# Patient Record
Sex: Female | Born: 1955 | Race: White | Hispanic: No | Marital: Married | Smoking: Never smoker
Health system: Southern US, Community
[De-identification: ages and names within clinical notes are randomized; demographics above are authoritative.]

## PROBLEM LIST (undated history)

## (undated) DIAGNOSIS — H15091 Other scleritis, right eye: Secondary | ICD-10-CM

## (undated) DIAGNOSIS — D649 Anemia, unspecified: Secondary | ICD-10-CM

## (undated) DIAGNOSIS — Z833 Family history of diabetes mellitus: Secondary | ICD-10-CM

## (undated) DIAGNOSIS — N979 Female infertility, unspecified: Secondary | ICD-10-CM

## (undated) DIAGNOSIS — K4190 Unilateral femoral hernia, without obstruction or gangrene, not specified as recurrent: Secondary | ICD-10-CM

## (undated) DIAGNOSIS — R896 Abnormal cytological findings in specimens from other organs, systems and tissues: Secondary | ICD-10-CM

## (undated) DIAGNOSIS — Z811 Family history of alcohol abuse and dependence: Secondary | ICD-10-CM

## (undated) DIAGNOSIS — N809 Endometriosis, unspecified: Secondary | ICD-10-CM

## (undated) DIAGNOSIS — M810 Age-related osteoporosis without current pathological fracture: Secondary | ICD-10-CM

## (undated) DIAGNOSIS — N6009 Solitary cyst of unspecified breast: Secondary | ICD-10-CM

## (undated) HISTORY — DX: Age-related osteoporosis without current pathological fracture: M81.0

## (undated) HISTORY — DX: Unilateral femoral hernia, without obstruction or gangrene, not specified as recurrent: K41.90

## (undated) HISTORY — DX: Female infertility, unspecified: N97.9

## (undated) HISTORY — PX: BUNIONECTOMY: SHX129

## (undated) HISTORY — PX: COLONOSCOPY: SHX174

## (undated) HISTORY — DX: Anemia, unspecified: D64.9

## (undated) HISTORY — DX: Abnormal cytological findings in specimens from other organs, systems and tissues: R89.6

## (undated) HISTORY — DX: Endometriosis, unspecified: N80.9

## (undated) HISTORY — DX: Solitary cyst of unspecified breast: N60.09

## (undated) HISTORY — DX: Family history of diabetes mellitus: Z83.3

## (undated) HISTORY — DX: Family history of alcohol abuse and dependence: Z81.1

## (undated) HISTORY — PX: TONSILLECTOMY: SUR1361

## (undated) HISTORY — DX: Other scleritis, right eye: H15.091

---

## 1988-10-27 HISTORY — PX: LAPAROSCOPY: SHX197

## 1989-10-27 HISTORY — PX: OTHER SURGICAL HISTORY: SHX169

## 1990-10-27 HISTORY — PX: OTHER SURGICAL HISTORY: SHX169

## 1998-10-25 ENCOUNTER — Other Ambulatory Visit: Admission: RE | Admit: 1998-10-25 | Discharge: 1998-10-25 | Payer: Self-pay | Admitting: Obstetrics and Gynecology

## 1999-11-15 ENCOUNTER — Other Ambulatory Visit: Admission: RE | Admit: 1999-11-15 | Discharge: 1999-11-15 | Payer: Self-pay | Admitting: Obstetrics and Gynecology

## 2000-12-26 ENCOUNTER — Emergency Department (HOSPITAL_COMMUNITY): Admission: EM | Admit: 2000-12-26 | Discharge: 2000-12-27 | Payer: Self-pay | Admitting: Internal Medicine

## 2001-08-24 ENCOUNTER — Encounter: Admission: RE | Admit: 2001-08-24 | Discharge: 2001-08-24 | Payer: Self-pay | Admitting: Orthopedic Surgery

## 2001-08-24 ENCOUNTER — Encounter: Payer: Self-pay | Admitting: Orthopedic Surgery

## 2001-12-31 ENCOUNTER — Other Ambulatory Visit: Admission: RE | Admit: 2001-12-31 | Discharge: 2001-12-31 | Payer: Self-pay | Admitting: Obstetrics and Gynecology

## 2003-02-17 ENCOUNTER — Other Ambulatory Visit: Admission: RE | Admit: 2003-02-17 | Discharge: 2003-02-17 | Payer: Self-pay | Admitting: Obstetrics and Gynecology

## 2004-03-27 ENCOUNTER — Other Ambulatory Visit: Admission: RE | Admit: 2004-03-27 | Discharge: 2004-03-27 | Payer: Self-pay | Admitting: Obstetrics and Gynecology

## 2004-10-27 DIAGNOSIS — IMO0001 Reserved for inherently not codable concepts without codable children: Secondary | ICD-10-CM

## 2004-10-27 HISTORY — DX: Reserved for inherently not codable concepts without codable children: IMO0001

## 2005-05-21 ENCOUNTER — Other Ambulatory Visit: Admission: RE | Admit: 2005-05-21 | Discharge: 2005-05-21 | Payer: Self-pay | Admitting: Obstetrics and Gynecology

## 2005-11-21 ENCOUNTER — Other Ambulatory Visit: Admission: RE | Admit: 2005-11-21 | Discharge: 2005-11-21 | Payer: Self-pay | Admitting: Obstetrics and Gynecology

## 2006-07-10 ENCOUNTER — Other Ambulatory Visit: Admission: RE | Admit: 2006-07-10 | Discharge: 2006-07-10 | Payer: Self-pay | Admitting: Obstetrics and Gynecology

## 2006-07-31 ENCOUNTER — Ambulatory Visit: Payer: Self-pay | Admitting: Gastroenterology

## 2006-08-26 ENCOUNTER — Ambulatory Visit: Payer: Self-pay | Admitting: Gastroenterology

## 2007-06-03 ENCOUNTER — Encounter: Payer: Self-pay | Admitting: Family Medicine

## 2007-09-07 ENCOUNTER — Other Ambulatory Visit: Admission: RE | Admit: 2007-09-07 | Discharge: 2007-09-07 | Payer: Self-pay | Admitting: Obstetrics and Gynecology

## 2008-09-15 ENCOUNTER — Other Ambulatory Visit: Admission: RE | Admit: 2008-09-15 | Discharge: 2008-09-15 | Payer: Self-pay | Admitting: Obstetrics and Gynecology

## 2008-09-20 ENCOUNTER — Encounter: Payer: Self-pay | Admitting: Family Medicine

## 2009-10-08 LAB — CONVERTED CEMR LAB: Pap Smear: NORMAL

## 2010-09-16 ENCOUNTER — Ambulatory Visit: Payer: Self-pay | Admitting: Family Medicine

## 2010-09-16 DIAGNOSIS — Z9189 Other specified personal risk factors, not elsewhere classified: Secondary | ICD-10-CM | POA: Insufficient documentation

## 2010-09-16 DIAGNOSIS — M810 Age-related osteoporosis without current pathological fracture: Secondary | ICD-10-CM

## 2010-10-11 ENCOUNTER — Encounter: Payer: Self-pay | Admitting: Family Medicine

## 2010-11-26 NOTE — Letter (Signed)
Summary: Cardiac & Vascular Screening/[facility]  Cardiac & Vascular Screening/[facility]   Imported By: Lanelle Bal 09/26/2010 09:25:11  _____________________________________________________________________  External Attachment:    Type:   Image     Comment:   External Document

## 2010-11-26 NOTE — Assessment & Plan Note (Signed)
Summary: NEW PT TO EST/ [employer]   CYD   Vital Signs:  Patient profile:   55 year old female LMP:     05/27/2009 Height:      67 inches Weight:      119 pounds BMI:     18.71 Temp:     98.4 degrees F oral Pulse rate:   64 / minute Pulse rhythm:   regular BP sitting:   118 / 72  (left arm) Cuff size:   regular  Vitals Entered By: Delilah Shan CMA [facility]) (September 16, 2010 10:09 AM) CC: New Patient to Establish LMP (date): 05/27/2009     Enter LMP: 05/27/2009 Last PAP Result normal   History of Present Illness: H/o osteopenina with follow up DXA to be done in near future.  Has good vit D/calcium intake.  H/o spiral fx in L 5th MT.  H/o weight bearing exercise added into weekly routine.   Allergies (verified): No Known Drug Allergies  Past History:  Past Medical History: OSTEOPENIA (ICD-733.90) FAMILY HISTORY DIABETES 1ST DEGREE RELATIVE (ICD-V18.0) FAMILY HISTORY OF ALCOHOLISM/ADDICTION (ICD-V61.41) CHICKENPOX, HX OF (ICD-V15.9)    Past Surgical History: 1963  Tonsils and adenoids 1990  Laparoscopy 1991  Breast Biopsy and benign lump removal 1991  Abdominal - Endometriosis 2011  Bunionectomy  Family History: Reviewed history and no changes required. Family History of Alcoholism/Addiction, brother Family History of Arthritis, parents, grandparents Family History Diabetes 1st degree relative, sister Family History High cholesterol, parents Family History Hypertension, parents, grandparents Family History of Parkinson's, mother Family History of Heart Disease, parents, grandparents F alive, little contact with patient, CAD (6 CABG) M alive with parkinson's disease in ALF parents are separated  Social History: Reviewed history and no changes required. Occupation:  Therapist, music Education:  Masters (MBA) From [place] Married, 1980 1 son died after premature birth 1 son alive with h/o spinal cord injury, now ambulatory (in med school at  [employer]) rare alcohol no smoking exercises several times a week and works at her church Masters at [employer], works at [employer]  Review of Systems       See HPI.  Otherwise negative.    Physical Exam  General:  GEN: nad, alert and oriented HEENT: mucous membranes moist NECK: supple w/o LA CV: rrr.  no murmur PULM: ctab, no inc wob ABD: soft, +bs EXT: no edema SKIN: no acute rash    Impression & Recommendations:  Problem # 1:  OSTEOPENIA (ICD-733.90) has follow up for DXA via gyn. No change in meds at this time. follow up as needed. She agrees.  Her updated medication list for this problem includes:    Ergocalciferol 50000 Unit Caps (Ergocalciferol) .Marland Kitchen... Take 1 capsule 2 times per month    Calcium Carbonate-vitamin D 600-400 Mg-unit Tabs (Calcium carbonate-vitamin d) .Marland Kitchen... Take 1 tablet by mouth once a day  Complete Medication List: 1)  Ergocalciferol 50000 Unit Caps (Ergocalciferol) .... Take 1 capsule 2 times per month 2)  Calcium Carbonate-vitamin D 600-400 Mg-unit Tabs (Calcium carbonate-vitamin d) .... Take 1 tablet by mouth once a day 3)  Multivitamins Tabs (Multiple vitamin) .... Take 1 tablet by mouth once a day  Patient Instructions: 1)  Take care.  Glad to see you today.  Please have your records forwarded here and I'll review them.  Let me know if you have any questions or concerns.     Orders Added: 1)  Est. Patient Level II [16109]   Immunization History:  Influenza Immunization History:  Influenza:  historical (08/28/2010)   Immunization History:  Influenza Immunization History:    Influenza:  Historical (08/28/2010)  Current Allergies (reviewed today): No known allergies    Orders Added: 1)  Est. Patient Level II [91478]   Preventive Care Screening  Mammogram:    Date:  04/25/2010    Results:  normal   Pap Smear:    Date:  10/08/2009    Results:  normal   Colonoscopy:    Date:  08/26/2006    Results:  normal

## 2011-05-05 ENCOUNTER — Encounter: Payer: Self-pay | Admitting: *Deleted

## 2011-05-06 ENCOUNTER — Encounter: Payer: Self-pay | Admitting: Family Medicine

## 2012-05-07 ENCOUNTER — Encounter: Payer: Self-pay | Admitting: Family Medicine

## 2012-05-10 ENCOUNTER — Encounter: Payer: Self-pay | Admitting: *Deleted

## 2012-06-15 ENCOUNTER — Encounter: Payer: Self-pay | Admitting: Family Medicine

## 2012-06-15 ENCOUNTER — Ambulatory Visit (INDEPENDENT_AMBULATORY_CARE_PROVIDER_SITE_OTHER): Payer: BC Managed Care – PPO | Admitting: Family Medicine

## 2012-06-15 VITALS — BP 104/78 | HR 69 | Temp 98.3°F | Wt 120.0 lb

## 2012-06-15 DIAGNOSIS — L989 Disorder of the skin and subcutaneous tissue, unspecified: Secondary | ICD-10-CM | POA: Insufficient documentation

## 2012-06-15 MED ORDER — TRIAMCINOLONE ACETONIDE 0.1 % EX CREA: TOPICAL_CREAM | Freq: Two times a day (BID) | CUTANEOUS | Status: AC

## 2012-06-15 NOTE — Patient Instructions (Addendum)
Use the triamcinolone on the area twice a day.  It should gradually resolve.  Take care.

## 2012-06-15 NOTE — Assessment & Plan Note (Signed)
Unclear source, doesn't appear infected.  Would use topical TAC for the irritation and f/u prn.  She agrees.

## 2012-06-15 NOTE — Progress Notes (Signed)
"  Bites" on R lower shin (2 spots). Not itching as much now but puffy and red after exercise or being upright.  It was itching prev and had a blister.  No known or identified cause.  Present for about 1 week. It was redder last night after exercise, better this AM.  Not ttp but the skin feels sensitive and puffy.    Meds, vitals, and allergies reviewed.   ROS: See HPI.  Otherwise, noncontributory.  nad ncat rrr ctab R lower shin with 2 lesions, both appear to be flat excoriated lesions with central scabbing but w/o fluctuance.  Each about 1 cm diameter with mild surrounding erythema.

## 2012-09-06 ENCOUNTER — Ambulatory Visit (INDEPENDENT_AMBULATORY_CARE_PROVIDER_SITE_OTHER): Payer: BC Managed Care – PPO

## 2012-09-06 DIAGNOSIS — Z23 Encounter for immunization: Secondary | ICD-10-CM

## 2013-03-20 ENCOUNTER — Other Ambulatory Visit: Payer: Self-pay | Admitting: Obstetrics and Gynecology

## 2013-03-22 NOTE — Telephone Encounter (Signed)
eScribe request for refill on PROGESTERONE 100 Last filled - 12/10/12, 90 day x 3 Last AEX - 12/10/12 Next AEX - 12/16/13 RX denied.  Pt given refill for 1 year at AEX on 12/10/12.

## 2013-05-12 ENCOUNTER — Encounter: Payer: Self-pay | Admitting: Obstetrics and Gynecology

## 2013-05-12 ENCOUNTER — Telehealth: Payer: Self-pay | Admitting: Obstetrics and Gynecology

## 2013-05-13 ENCOUNTER — Ambulatory Visit (INDEPENDENT_AMBULATORY_CARE_PROVIDER_SITE_OTHER): Payer: BC Managed Care – PPO | Admitting: Obstetrics and Gynecology

## 2013-05-13 ENCOUNTER — Other Ambulatory Visit: Payer: Self-pay | Admitting: Orthopedic Surgery

## 2013-05-13 ENCOUNTER — Encounter: Payer: Self-pay | Admitting: Obstetrics and Gynecology

## 2013-05-13 VITALS — BP 118/70 | HR 82 | Resp 20 | Ht 66.75 in | Wt 119.0 lb

## 2013-05-13 DIAGNOSIS — N63 Unspecified lump in unspecified breast: Secondary | ICD-10-CM

## 2013-05-13 NOTE — Progress Notes (Signed)
57 yo MWF G2P1101 on femring 0.05 mg and P4 100 mg qd.    MM July 2013 nl.  Pt noted a small "cyst" in right breast on BSE, and when she went for her routine MM yesterday, she mentioned it, and they would not do a MM without an order from me. Pt reports cyst is non tender and has not changed in size over about 2 weeks.  No FH breast cancer.,  Exam:  Breast appear symmetrical, no skin changes, no adenopathy.  Left breast nl.  Right breast felt nl to me, but when I asked pt to point out the area of concern, she was able to point out a "BB" like area at 9 o'clock about 2 fb outside the areola.  It is very very very mobile, smooth, and completely c/w a tiny cyst.    A:  Right breast cyst, small  P:  Refer to [facility] for diag MM and poss usg on Rt, Screening MM on left.

## 2013-06-23 ENCOUNTER — Ambulatory Visit (INDEPENDENT_AMBULATORY_CARE_PROVIDER_SITE_OTHER): Payer: BC Managed Care – PPO | Admitting: Family Medicine

## 2013-06-23 ENCOUNTER — Encounter: Payer: Self-pay | Admitting: Family Medicine

## 2013-06-23 VITALS — BP 102/70 | HR 73 | Temp 97.5°F | Wt 118.0 lb

## 2013-06-23 DIAGNOSIS — H6980 Other specified disorders of Eustachian tube, unspecified ear: Secondary | ICD-10-CM | POA: Insufficient documentation

## 2013-06-23 DIAGNOSIS — H6990 Unspecified Eustachian tube disorder, unspecified ear: Secondary | ICD-10-CM | POA: Insufficient documentation

## 2013-06-23 DIAGNOSIS — H6981 Other specified disorders of Eustachian tube, right ear: Secondary | ICD-10-CM

## 2013-06-23 MED ORDER — FLUTICASONE PROPIONATE 50 MCG/ACT NA SUSP: 2.0000 | Freq: Every day | NASAL | Status: DC

## 2013-06-23 NOTE — Progress Notes (Signed)
3-4 months of sx.  Stuffy, runny, waking with HA.  R max pain, R ear feels clogged, postnasal gtt.  No fevers. Blowing nose, sneezing.  occ ear popping and her hearing improves. Taking mucinex with relief, stopped med and sx returned immediately.  She doesn't typically have seasonal allergies this time of year.  Also took advil cold and sinus prev.  Not much cough.    Meds, vitals, and allergies reviewed.   ROS: See HPI.  Otherwise, noncontributory.  GEN: nad, alert and oriented HEENT: mucous membranes moist, tm w/o erythema, nasal exam w/o erythema, scant clear discharge noted,  OP without cobblestoning, R ETD noted, R max sinus minimally ttp NECK: supple w/o LA CV: rrr.   PULM: ctab, no inc wob R lateral subconjunctival injection noted.

## 2013-06-23 NOTE — Assessment & Plan Note (Signed)
Path/phys d/w pt.  Restart mucinex for now and add on flonase, then stop mucinex after a few days.  F/u prn.  Nontoxic.  Gently try to pop ears.

## 2013-06-23 NOTE — Patient Instructions (Addendum)
Gently try to pop your ears Start flonase and use mucinex in the meantime.  This should improve.

## 2013-12-16 ENCOUNTER — Encounter: Payer: Self-pay | Admitting: Obstetrics and Gynecology

## 2013-12-16 ENCOUNTER — Ambulatory Visit: Payer: Self-pay | Admitting: Obstetrics and Gynecology

## 2013-12-16 ENCOUNTER — Ambulatory Visit (INDEPENDENT_AMBULATORY_CARE_PROVIDER_SITE_OTHER): Payer: 59 | Admitting: Obstetrics and Gynecology

## 2013-12-16 VITALS — BP 127/59 | HR 77 | Resp 16 | Ht 66.75 in | Wt 118.0 lb

## 2013-12-16 DIAGNOSIS — R319 Hematuria, unspecified: Secondary | ICD-10-CM

## 2013-12-16 DIAGNOSIS — M949 Disorder of cartilage, unspecified: Secondary | ICD-10-CM

## 2013-12-16 DIAGNOSIS — M899 Disorder of bone, unspecified: Secondary | ICD-10-CM

## 2013-12-16 DIAGNOSIS — Z0000 Encounter for general adult medical examination without abnormal findings: Secondary | ICD-10-CM

## 2013-12-16 DIAGNOSIS — M8580 Other specified disorders of bone density and structure, unspecified site: Secondary | ICD-10-CM

## 2013-12-16 DIAGNOSIS — Z01419 Encounter for gynecological examination (general) (routine) without abnormal findings: Secondary | ICD-10-CM

## 2013-12-16 LAB — POCT URINALYSIS DIPSTICK
Bilirubin, UA: NEGATIVE
Glucose, UA: NEGATIVE
Ketones, UA: NEGATIVE
Leukocytes, UA: NEGATIVE
NITRITE UA: NEGATIVE
PROTEIN UA: NEGATIVE
RBC UA: 250
UROBILINOGEN UA: NEGATIVE
pH, UA: 7

## 2013-12-16 LAB — CBC
HCT: 39.5 % (ref 36.0–46.0)
Hemoglobin: 13.7 g/dL (ref 12.0–15.0)
MCH: 30 pg (ref 26.0–34.0)
MCHC: 34.7 g/dL (ref 30.0–36.0)
MCV: 86.4 fL (ref 78.0–100.0)
Platelets: 263 10*3/uL (ref 150–400)
RBC: 4.57 MIL/uL (ref 3.87–5.11)
RDW: 13.5 % (ref 11.5–15.5)
WBC: 6.1 10*3/uL (ref 4.0–10.5)

## 2013-12-16 LAB — COMPREHENSIVE METABOLIC PANEL
ALT: 15 U/L (ref 0–35)
AST: 15 U/L (ref 0–37)
Albumin: 4.7 g/dL (ref 3.5–5.2)
Alkaline Phosphatase: 36 U/L — ABNORMAL LOW (ref 39–117)
BUN: 14 mg/dL (ref 6–23)
CO2: 29 mEq/L (ref 19–32)
Calcium: 9.9 mg/dL (ref 8.4–10.5)
Chloride: 103 mEq/L (ref 96–112)
Creat: 0.7 mg/dL (ref 0.50–1.10)
Glucose, Bld: 82 mg/dL (ref 70–99)
Potassium: 4.6 mEq/L (ref 3.5–5.3)
Sodium: 139 mEq/L (ref 135–145)
Total Bilirubin: 0.8 mg/dL (ref 0.2–1.2)
Total Protein: 6.9 g/dL (ref 6.0–8.3)

## 2013-12-16 LAB — LIPID PANEL
Cholesterol: 202 mg/dL — ABNORMAL HIGH (ref 0–200)
HDL: 81 mg/dL (ref >39)
LDL Cholesterol: 109 mg/dL — ABNORMAL HIGH (ref 0–99)
Total CHOL/HDL Ratio: 2.5 Ratio
Triglycerides: 58 mg/dL (ref <150)
VLDL: 12 mg/dL (ref 0–40)

## 2013-12-16 LAB — HEMOGLOBIN, FINGERSTICK: Hemoglobin, fingerstick: 13 g/dL (ref 12.0–16.0)

## 2013-12-16 NOTE — Progress Notes (Signed)
GYNECOLOGY VISIT  PCP: Elsie Stain, MD [facility])  Referring provider:   HPI: 58 y.o.   Married  Caucasian  female   G2P1 with Patient's last menstrual period was 05/27/2009.   here for  Annual Exam/ Discuss HRT On and off HRT.     Stopped at the end of December. Occasional night sweats.  Sleeping well.  Family history of osteoporosis.  Personally has osteopenia.  Right breast cysts.  Ultrasound in January showing two cysts.  6 month follow up was recommended.  Has a right hernia. Occasionally protrudes.   Son moving to [place] for residency in Dermatology.   Hgb:  13.0 Urine:  250 blood - asymptomatic   GYNECOLOGIC HISTORY: Patient's last menstrual period was 05/27/2009. Sexually active:  no Partner preference: female Contraception:   Menoopausal Menopausal hormone therapy: Stop taking 09/2013 DES exposure:   no Blood transfusions:   no Sexually transmitted diseases:   HR HPV 2006 GYN procedures and prior surgeries:  Breast Biopsy 1991, Laparoscopy 1990 Last mammogram:    03/2013 (Cyst Rt Breast)   Rt Breast U/S 10/2013 (will monitor cyst, repeat in 6 mons)             Last pap and high risk HPV testing:   11/01/2010 neg   HRHPV testing was not done History of abnormal pap smear:  Yes  06/2005 ASC-US   +HPV   OB History   Grav Para Term Preterm Abortions TAB SAB Ect Mult Living   2 1        1        LIFESTYLE: Exercise:    Aerobics, Walking, Weights           Tobacco: no Alcohol: occ glass of wine Drug use:  no  OTHER HEALTH MAINTENANCE: Tetanus/TDap: 11/2012 Gardisil: no Influenza:  yes Zostavax: no  Bone density: Osteopenia 09/2010 Colonoscopy: 08/26/06 normal, repeat in 10 years  Cholesterol check: 11/2012  (great)  Family History  Problem Relation Age of Onset  . Arthritis Mother   . Hyperlipidemia Mother   . Hypertension Mother   . Parkinsonism Mother   . Heart disease Mother   . Arthritis Father   . Hyperlipidemia Father   . Hypertension Father    . Heart disease Father     CAD (6 CABG)  . Stroke Father   . Diabetes Sister   . Alcohol abuse Brother   . Arthritis Other   . Hypertension Other   . Heart disease Other     Patient Active Problem List   Diagnosis Date Noted  . ETD (eustachian tube dysfunction) 06/23/2013  . Skin lesion 06/15/2012  . OSTEOPENIA 09/16/2010  . CHICKENPOX, HX OF 09/16/2010   Past Medical History  Diagnosis Date  . Osteopenia   . Family history of diabetes mellitus     1st degree relative  . Family history of alcoholism   . Endometriosis   . ASCUS on Pap smear 2006    Asc-us pap w/HRHPV  . Infertility, female   . Femoral hernia     right  . Breast cyst 1998 1999    2005 x3  . Trisomy 13 11/93    [name]    Past Surgical History  Procedure Laterality Date  . Laparoscopy  1990    endometriosis  . Breast biopsy and benign lump removal Left 1992    Breast Bx atypia (L)  . Abdominal endometriosis  1991    removal right endometrioma  . Bunionectomy  2011 and  2012  . Tonsillectomy      ALLERGIES: Review of patient's allergies indicates no known allergies.  Current Outpatient Prescriptions  Medication Sig Dispense Refill  . Calcium Carbonate-Vitamin D (TH CALCIUM CARBONATE-VITAMIN D) 600-400 MG-UNIT per tablet Take 1 tablet by mouth daily.      . ergocalciferol (VITAMIN D2) 50000 UNITS capsule 50,000 Units. Every 2 weeks      . Multiple Vitamin (MULTIVITAMIN) tablet Take 1 tablet by mouth daily.      . Estradiol Acetate (FEMRING) 0.05 MG/24HR RING Place vaginally.      . fluticasone (FLONASE) 50 MCG/ACT nasal spray Place 2 sprays into the nose daily.  16 g  2  . progesterone (PROMETRIUM) 100 MG capsule Take 100 mg by mouth daily.       No current facility-administered medications for this visit.     ROS:  Pertinent items are noted in HPI.  SOCIAL HISTORY:  Married.   [employer] Scientist, physiological.  PHYSICAL EXAMINATION:    BP 127/59  Pulse 77  Resp 16  Ht 5' 6.75" (1.695 m)  Wt 118 lb  (53.524 kg)  BMI 18.63 kg/m2  LMP 05/27/2009   Wt Readings from Last 3 Encounters:  12/16/13 118 lb (53.524 kg)  06/23/13 118 lb (53.524 kg)  05/13/13 119 lb (53.978 kg)     Ht Readings from Last 3 Encounters:  12/16/13 5' 6.75" (1.695 m)  05/13/13 5' 6.75" (1.695 m)  09/16/10 5\' 7"  (1.702 m)    General appearance: alert, cooperative and appears stated age Head: Normocephalic, without obvious abnormality, atraumatic Neck: no adenopathy, supple, symmetrical, trachea midline and thyroid not enlarged, symmetric, no tenderness/mass/nodules Lungs: clear to auscultation bilaterally Breasts: Inspection negative, left breast with scar superiorly, No nipple retraction or dimpling, No nipple discharge or bleeding, No axillary or supraclavicular adenopathy, Normal to palpation without dominant masses Heart: regular rate and rhythm Abdomen: right inguinal? Hernia - nontender.  Thin, soft, non-tender; no masses,  no organomegaly Extremities: extremities normal, atraumatic, no cyanosis or edema Skin: Skin color, texture, turgor normal. No rashes or lesions Lymph nodes: Cervical, supraclavicular, and axillary nodes normal. No abnormal inguinal nodes palpated Neurologic: Grossly normal  Pelvic: External genitalia:  no lesions              Urethra:  normal appearing urethra with no masses, tenderness or lesions              Bartholins and Skenes: normal                 Vagina: normal appearing vagina with normal color and discharge, no lesions              Cervix: normal appearance              Pap and high risk HPV testing done: yes.            Bimanual Exam:  Uterus:  uterus is normal size, shape, consistency and nontender                                      Adnexa: normal adnexa in size, nontender and no masses                                      Rectovaginal: Confirms  Anus:  normal sphincter tone, no lesions  ASSESSMENT  Normal gynecologic  exam. Former HRT patient.  Osteopenia.  Right breast cysts.  Right inguinal hernia.  Microscopic hematuria.   PLAN  Mammogram recommended yearly.  Patient is also due for a right breast ultrasound recheck in July 2015.  Pap smear and high risk HPV testing. OK to stay off HRT.  Counseled on self breast exam, Calcium and vitamin D intake, exercise. Bone density. Lipid profile, CMP, CBC, Vit D. Referral will be made to General Surgery after patient does her personal research about surgeon of choice.  Urine micro and urine culture.  Return annually or prn   An After Visit Summary was printed and given to the patient.

## 2013-12-16 NOTE — Patient Instructions (Signed)

## 2013-12-17 LAB — URINALYSIS, MICROSCOPIC ONLY
Bacteria, UA: NONE SEEN
CASTS: NONE SEEN
CRYSTALS: NONE SEEN
SQUAMOUS EPITHELIAL / LPF: NONE SEEN

## 2013-12-17 LAB — VITAMIN D 25 HYDROXY (VIT D DEFICIENCY, FRACTURES): Vit D, 25-Hydroxy: 60 ng/mL (ref 30–89)

## 2013-12-17 LAB — URINE CULTURE
Colony Count: NO GROWTH
Organism ID, Bacteria: NO GROWTH

## 2013-12-21 ENCOUNTER — Encounter: Payer: Self-pay | Admitting: Obstetrics and Gynecology

## 2013-12-21 LAB — IPS PAP TEST WITH HPV

## 2013-12-27 ENCOUNTER — Telehealth: Payer: Self-pay | Admitting: Obstetrics and Gynecology

## 2013-12-27 ENCOUNTER — Other Ambulatory Visit: Payer: Self-pay | Admitting: Obstetrics and Gynecology

## 2013-12-27 DIAGNOSIS — K4090 Unilateral inguinal hernia, without obstruction or gangrene, not specified as recurrent: Secondary | ICD-10-CM

## 2013-12-27 NOTE — Telephone Encounter (Signed)
Order for referral to Dr. Margot Chimes placed by me.   Thank you!

## 2013-12-27 NOTE — Telephone Encounter (Signed)
Spoke with pt to inform her Pap and HPV testing was negative. Pt pleased. Pt is requesting Dr. Margot Chimes from [facility] for her referral. Advised we would place referral and they would contact her for an appt. Pt agreeable.

## 2013-12-27 NOTE — Telephone Encounter (Signed)
Pt would like a referral to [facility] for a hernia that she and Dr Quincy Simmonds discussed. Would also like to get the results from her pap smear.

## 2013-12-28 NOTE — Telephone Encounter (Signed)
Sabrina Cirri, do you know if this patient is aware of appointment? Should I call?

## 2013-12-29 NOTE — Telephone Encounter (Signed)
Sabrina Hansen,  Patient is aware. Dr's office contacted the patient directly to schedule.

## 2014-01-05 ENCOUNTER — Encounter (INDEPENDENT_AMBULATORY_CARE_PROVIDER_SITE_OTHER): Payer: Self-pay | Admitting: General Surgery

## 2014-01-05 ENCOUNTER — Ambulatory Visit (INDEPENDENT_AMBULATORY_CARE_PROVIDER_SITE_OTHER): Payer: 59 | Admitting: General Surgery

## 2014-01-05 VITALS — BP 122/70 | HR 70 | Temp 98.4°F | Resp 12 | Ht 66.0 in | Wt 121.0 lb

## 2014-01-05 DIAGNOSIS — K4090 Unilateral inguinal hernia, without obstruction or gangrene, not specified as recurrent: Secondary | ICD-10-CM | POA: Insufficient documentation
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
## 2014-01-05 NOTE — Patient Instructions (Signed)
[facility] _______[facility]  INGUINAL HERNIA REPAIR: POST OP INSTRUCTIONS  Always review your discharge instruction sheet given to you by the facility where your surgery was performed. IF YOU HAVE DISABILITY OR FAMILY LEAVE FORMS, YOU MUST BRING THEM TO THE OFFICE FOR PROCESSING.   DO NOT GIVE THEM TO YOUR DOCTOR.  1. A  prescription for pain medication may be given to you upon discharge.  Take your pain medication as prescribed, if needed.  If narcotic pain medicine is not needed, then you may take acetaminophen (Tylenol) or ibuprofen (Advil) as needed. 2. Take your usually prescribed medications unless otherwise directed. 3. If you need a refill on your pain medication, please contact your pharmacy.  They will contact our office to request authorization. Prescriptions will not be filled after 5 pm or on week-ends. 4. You should follow a light diet the first 24 hours after arrival home, such as soup and crackers, etc.  Be sure to include lots of fluids daily.  Resume your normal diet the day after surgery. 5. Most patients will experience some swelling and bruising in the groin.  Ice packs and reclining will help.  Swelling and bruising can take several days to resolve.  6. It is common to experience some constipation if taking pain medication after surgery.  Increasing fluid intake and taking a stool softener (such as Colace) will usually help or prevent this problem from occurring.  A mild laxative (Milk of Magnesia or Miralax) should be taken according to package directions if there are no bowel movements after 48 hours. 7. Unless discharge instructions indicate otherwise, you may remove your bandages 24-48 hours after surgery, and you may shower at that time.  You may have steri-strips (small skin tapes) in place directly over the incision.  These strips should be left on the skin for 7-10 days.  If your surgeon used skin glue on the incision, you may shower in 24 hours.  The glue will  flake off over the next 2-3 weeks.  Any sutures or staples will be removed at the office during your follow-up visit. 8. ACTIVITIES:  You may resume regular (light) daily activities beginning the next day-such as daily self-care, walking, climbing stairs-gradually increasing activities as tolerated.  You may have sexual intercourse when it is comfortable.  Refrain from any heavy lifting or straining until approved by your doctor. a. You may drive when you are no longer taking prescription pain medication, you can comfortably wear a seatbelt, and you can safely maneuver your car and apply brakes. b. RETURN TO WORK:  _Does work in one week. Full activities in 6 weeks._________________________________________________________ 9. You should see your doctor in the office for a follow-up appointment approximately 2-3 weeks after your surgery.  Make sure that you call for this appointment within a day or two after you arrive home to insure a convenient appointment time. 10. OTHER INSTRUCTIONS:  __________________________________________________________________________________________________________________________________________________________________________________________  WHEN TO CALL YOUR DOCTOR: 1. Fever over 101.0 2. Inability to urinate 3. Nausea and/or vomiting 4. Extreme swelling or bruising 5. Continued bleeding from incision. 6. Increased pain, redness, or drainage from the incision  The clinic staff is available to answer your questions during regular business hours.  Please don't hesitate to call and ask to speak to one of the nurses for clinical concerns.  If you have a medical emergency, go to the nearest emergency room or call 911.  A surgeon from [facility] is always on call at the hospital  [address] [place], [place], [place]  [address] ?  [address], [place], [place]   [address] [phone] ? [phone] ? FAX [phone] Web site:  [email or web address]

## 2014-01-17 ENCOUNTER — Other Ambulatory Visit: Payer: Self-pay | Admitting: Obstetrics and Gynecology

## 2014-01-17 NOTE — Telephone Encounter (Addendum)
Last AEX 12/16/2013 Next appt 12/22/2014 Last AEX note stated pt was OK to stay off HRT.   Please approve or deny rx.  Paper Chart in your door.

## 2014-04-05 ENCOUNTER — Telehealth (INDEPENDENT_AMBULATORY_CARE_PROVIDER_SITE_OTHER): Payer: Self-pay

## 2014-04-05 NOTE — Telephone Encounter (Signed)
Unsuccessful attempt to reach pt on her cell and home phone.  She is scheduled to return to see Dr. Zella Richer on 04/19/14 at 10:10 a.m., to discuss and schedule inguinal hernia repair.

## 2014-04-19 ENCOUNTER — Encounter (INDEPENDENT_AMBULATORY_CARE_PROVIDER_SITE_OTHER): Payer: 59 | Admitting: General Surgery

## 2014-05-27 HISTORY — PX: INGUINAL HERNIA REPAIR: SUR1180

## 2014-06-22 ENCOUNTER — Other Ambulatory Visit (INDEPENDENT_AMBULATORY_CARE_PROVIDER_SITE_OTHER): Payer: Self-pay

## 2014-06-22 DIAGNOSIS — K4090 Unilateral inguinal hernia, without obstruction or gangrene, not specified as recurrent: Secondary | ICD-10-CM

## 2014-06-22 MED ORDER — OXYCODONE HCL 5 MG PO TABS: 5.0000 mg | ORAL_TABLET | ORAL | Status: DC | PRN

## 2014-07-12 ENCOUNTER — Encounter (INDEPENDENT_AMBULATORY_CARE_PROVIDER_SITE_OTHER): Payer: 59 | Admitting: General Surgery

## 2014-08-28 ENCOUNTER — Encounter (INDEPENDENT_AMBULATORY_CARE_PROVIDER_SITE_OTHER): Payer: Self-pay | Admitting: General Surgery

## 2014-10-27 DIAGNOSIS — M810 Age-related osteoporosis without current pathological fracture: Secondary | ICD-10-CM

## 2014-10-27 HISTORY — DX: Age-related osteoporosis without current pathological fracture: M81.0

## 2014-12-22 ENCOUNTER — Encounter: Payer: Self-pay | Admitting: Obstetrics and Gynecology

## 2014-12-22 ENCOUNTER — Ambulatory Visit (INDEPENDENT_AMBULATORY_CARE_PROVIDER_SITE_OTHER): Payer: 59 | Admitting: Obstetrics and Gynecology

## 2014-12-22 VITALS — BP 120/76 | HR 76 | Resp 12 | Ht 66.75 in | Wt 118.8 lb

## 2014-12-22 DIAGNOSIS — Z0000 Encounter for general adult medical examination without abnormal findings: Secondary | ICD-10-CM

## 2014-12-22 DIAGNOSIS — R319 Hematuria, unspecified: Secondary | ICD-10-CM

## 2014-12-22 DIAGNOSIS — Z01419 Encounter for gynecological examination (general) (routine) without abnormal findings: Secondary | ICD-10-CM

## 2014-12-22 LAB — CBC
HCT: 38.6 % (ref 36.0–46.0)
Hemoglobin: 12.9 g/dL (ref 12.0–15.0)
MCH: 29.4 pg (ref 26.0–34.0)
MCHC: 33.4 g/dL (ref 30.0–36.0)
MCV: 87.9 fL (ref 78.0–100.0)
MPV: 10.2 fL (ref 8.6–12.4)
PLATELETS: 250 10*3/uL (ref 150–400)
RBC: 4.39 MIL/uL (ref 3.87–5.11)
RDW: 13.1 % (ref 11.5–15.5)
WBC: 5.5 10*3/uL (ref 4.0–10.5)

## 2014-12-22 LAB — COMPREHENSIVE METABOLIC PANEL
ALT: 13 U/L (ref 0–35)
AST: 15 U/L (ref 0–37)
Albumin: 4.4 g/dL (ref 3.5–5.2)
Alkaline Phosphatase: 50 U/L (ref 39–117)
BUN: 12 mg/dL (ref 6–23)
CO2: 28 mEq/L (ref 19–32)
Calcium: 9.8 mg/dL (ref 8.4–10.5)
Chloride: 103 mEq/L (ref 96–112)
Creat: 0.82 mg/dL (ref 0.50–1.10)
Glucose, Bld: 83 mg/dL (ref 70–99)
Potassium: 4 mEq/L (ref 3.5–5.3)
SODIUM: 139 meq/L (ref 135–145)
TOTAL PROTEIN: 6.7 g/dL (ref 6.0–8.3)
Total Bilirubin: 0.6 mg/dL (ref 0.2–1.2)

## 2014-12-22 LAB — LIPID PANEL
CHOL/HDL RATIO: 2.1 ratio
Cholesterol: 193 mg/dL (ref 0–200)
HDL: 90 mg/dL (ref >=46)
LDL CALC: 93 mg/dL (ref 0–99)
Triglycerides: 49 mg/dL (ref <150)
VLDL: 10 mg/dL (ref 0–40)

## 2014-12-22 LAB — TSH: TSH: 0.67 u[IU]/mL (ref 0.350–4.500)

## 2014-12-22 NOTE — Patient Instructions (Signed)

## 2014-12-22 NOTE — Progress Notes (Signed)
sSPatient ID: Sabrina Hansen, female   DOB: 25-Apr-1956, 59 y.o.   MRN: [id number] 58 y.o. G2P1 MarriedCaucasianF here for annual exam.   No bleeding or spotting.  Stopped HRT in December 2014.   History of right breast cysts and right inguinal hernia repair.  Had mesh placement. Due in August for right breast follow up at [facility].  Father passed away.  Mother with Parkinson's disease.   Retiring in one month.  Feels good about this choice.   Feels tingling feeling in both arms and both legs occurring during stressful periods.  Improves with exercise.  Eats well.  Taking a multivitamin.   PCP:  Elsie Stain, MD  Patient's last menstrual period was 05/27/2009.          Sexually active: No.  The current method of family planning is post menopausal status.    Exercising: Yes.    aerobics, weights and walking. Smoker:  no  Health Maintenance: Pap:  12-16-13 wnl:neg HR HPV History of abnormal Pap:  Yes, 0/2006 Acus/Pos HR HPV, colposcopy 07-25-05 showed atypia on cervical bx and ECC negative.  No Treatment to cervix and paps normal since.  MMG:  11-17-13 u/s right breast revealed 81mm complex cyst at 11 o'clock, 32mm simple cyst in right breast at 10 o'clock.;mmg and u/s 04-2314  Revealed 66mm complex cyst probably benign and recommend repeat u/s 6 mo.  Ultrasound 12-02-14 showed complex cyst in right breast had decreased to 64mm, no evidence of malignancy.  Repeat u/s with mammogram in 05/2015:[facility] Colonoscopy:  08-26-2006 wnl with Dr. Verl Blalock.  Next colonoscopy 07/2016. BMD:   09/2010 osteopenia:[facility] TDaP:  11/2012 Screening Labs:  Hb today: 12.5, Urine today: Trace RBC's - Asymptomatic    reports that she has never smoked. She has never used smokeless tobacco. She reports that she drinks about 0.6 oz of alcohol per week. She reports that she does not use illicit drugs.  Past Medical History  Diagnosis Date  . Osteopenia   . Family history of diabetes mellitus     1st degree  relative  . Family history of alcoholism   . Endometriosis   . ASCUS on Pap smear 2006    Asc-us pap w/HRHPV  . Infertility, female   . Femoral hernia     right  . Breast cyst 1998 1999    2005 x3  . Trisomy 13 11/93    [name]    Past Surgical History  Procedure Laterality Date  . Laparoscopy  1990    endometriosis  . Breast biopsy and benign lump removal Left 1992    Breast Bx atypia (L)  . Abdominal endometriosis  1991    removal right endometrioma  . Bunionectomy  2011 and 2012  . Tonsillectomy    . Inguinal hernia repair  05/2014    Dr. Barkley Bruns    Current Outpatient Prescriptions  Medication Sig Dispense Refill  . Calcium Carbonate-Vitamin D (TH CALCIUM CARBONATE-VITAMIN D) 600-400 MG-UNIT per tablet Take 1 tablet by mouth daily.    . Multiple Vitamin (MULTIVITAMIN) tablet Take 1 tablet by mouth daily.     No current facility-administered medications for this visit.    Family History  Problem Relation Age of Onset  . Arthritis Mother   . Hyperlipidemia Mother   . Hypertension Mother   . Parkinsonism Mother   . Heart disease Mother   . Arthritis Father   . Hyperlipidemia Father   . Hypertension Father   . Heart disease Father  CAD (6 CABG)  . Stroke Father   . Diabetes Sister   . Alcohol abuse Brother   . Arthritis Other   . Hypertension Other   . Heart disease Other     ROS:  Pertinent items are noted in HPI.  Otherwise, a comprehensive ROS was negative.  Exam:   BP 120/76 mmHg  Pulse 76  Resp 12  Ht 5' 6.75" (1.695 m)  Wt 118 lb 12.8 oz (53.887 kg)  BMI 18.76 kg/m2  LMP 05/27/2009     Height: 5' 6.75" (169.5 cm)  Ht Readings from Last 3 Encounters:  12/22/14 5' 6.75" (1.695 m)  01/05/14 5\' 6"  (1.676 m)  12/16/13 5' 6.75" (1.695 m)    General appearance: alert, cooperative and appears stated age Head: Normocephalic, without obvious abnormality, atraumatic Neck: no adenopathy, supple, symmetrical, trachea midline and thyroid normal to  inspection and palpation Lungs: clear to auscultation bilaterally Breasts: normal appearance, no masses or tenderness, Inspection negative, No nipple retraction or dimpling, No nipple discharge or bleeding, No axillary or supraclavicular adenopathy Heart: regular rate and rhythm Abdomen: Pfannenstiel incision with extension into right groin for herniorrhaphy incision.  Abdomen soft, non-tender; bowel sounds normal; no masses,  no organomegaly Extremities: extremities normal, atraumatic, no cyanosis or edema Skin: Skin color, texture, turgor normal. No rashes or lesions Lymph nodes: Cervical, supraclavicular, and axillary nodes normal. No abnormal inguinal nodes palpated Neurologic: Grossly normal   Pelvic: External genitalia:  no lesions              Urethra:  normal appearing urethra with no masses, tenderness or lesions              Bartholins and Skenes: normal                 Vagina: normal appearing vagina with normal color and discharge, no lesions              Cervix: no lesions              Pap taken: No. Bimanual Exam:  Uterus:  normal size, contour, position, consistency, mobility, non-tender              Adnexa: normal adnexa and no mass, fullness, tenderness               Rectovaginal: Confirms               Anus:  normal sphincter tone, no lesions  Chaperone was present for exam.  A:  Well Woman with normal exam Osteopenia.  Microscopic hematuria.  Situational stress.   P:   Mammogram - due in August.  Bone density in the fall. Routine labs including TSH. Urine micro and culture.  Ca/vit D/weight bearing exercise.  pap smear not indicated. Congratulations on retirement.  return annually or prn

## 2014-12-23 LAB — URINALYSIS, MICROSCOPIC ONLY
Bacteria, UA: NONE SEEN
Casts: NONE SEEN
Crystals: NONE SEEN
Squamous Epithelial / LPF: NONE SEEN

## 2014-12-24 LAB — URINE CULTURE
COLONY COUNT: NO GROWTH
ORGANISM ID, BACTERIA: NO GROWTH

## 2014-12-25 LAB — HEMOGLOBIN, FINGERSTICK: HEMOGLOBIN, FINGERSTICK: 12.5 g/dL (ref 12.0–16.0)

## 2014-12-28 ENCOUNTER — Telehealth: Payer: Self-pay | Admitting: Obstetrics and Gynecology

## 2014-12-28 NOTE — Telephone Encounter (Signed)
Spoke with patient. Advised hemoglobin on 12/22/14 was 12.5. Patient is agreeable and verbalizes understanding.  Routing to provider for final review. Patient agreeable to disposition. Will close encounter

## 2014-12-28 NOTE — Telephone Encounter (Signed)
Patient calling for recent hemoglobin results.

## 2014-12-28 NOTE — Telephone Encounter (Signed)
Left message to call Sabrina Hansen at [phone]. 

## 2015-05-29 ENCOUNTER — Telehealth: Payer: Self-pay

## 2015-05-29 NOTE — Telephone Encounter (Signed)
Spoke with patient and advised BMD revealing Osteoporosis of Right hip and Dr. Quincy Simmonds would like her to schedule consultation to discuss further.  Made appointment with Dr. Quincy Simmonds on 06-15-15 at 9:30am.

## 2015-06-15 ENCOUNTER — Encounter: Payer: Self-pay | Admitting: Obstetrics and Gynecology

## 2015-06-15 ENCOUNTER — Ambulatory Visit (INDEPENDENT_AMBULATORY_CARE_PROVIDER_SITE_OTHER): Payer: 59 | Admitting: Obstetrics and Gynecology

## 2015-06-15 VITALS — BP 120/76 | HR 64 | Ht 66.75 in | Wt 120.2 lb

## 2015-06-15 DIAGNOSIS — M810 Age-related osteoporosis without current pathological fracture: Secondary | ICD-10-CM | POA: Diagnosis not present

## 2015-06-15 NOTE — Patient Instructions (Addendum)
Osteoporosis Throughout your life, your body breaks down old bone and replaces it with new bone. As you get older, your body does not replace bone as quickly as it breaks it down. By the age of 30 years, most people begin to gradually lose bone because of the imbalance between bone loss and replacement. Some people lose more bone than others. Bone loss beyond a specified normal degree is considered osteoporosis.  Osteoporosis affects the strength and durability of your bones. The inside of the ends of your bones and your flat bones, like the bones of your pelvis, look like honeycomb, filled with tiny open spaces. As bone loss occurs, your bones become less dense. This means that the open spaces inside your bones become bigger and the walls between these spaces become thinner. This makes your bones weaker. Bones of a person with osteoporosis can become so weak that they can break (fracture) during minor accidents, such as a simple fall. CAUSES  The following factors have been associated with the development of osteoporosis:  Smoking.  Drinking more than 2 alcoholic drinks several days per week.  Long-term use of certain medicines:  Corticosteroids.  Chemotherapy medicines.  Thyroid medicines.  Antiepileptic medicines.  Gonadal hormone suppression medicine.  Immunosuppression medicine.  Being underweight.  Lack of physical activity.  Lack of exposure to the sun. This can lead to vitamin D deficiency.  Certain medical conditions:  Certain inflammatory bowel diseases, such as Crohn disease and ulcerative colitis.  Diabetes.  Hyperthyroidism.  Hyperparathyroidism. RISK FACTORS Anyone can develop osteoporosis. However, the following factors can increase your risk of developing osteoporosis:  Gender--Women are at higher risk than men.  Age--Being older than 50 years increases your risk.  Ethnicity--White and Asian people have an increased risk.  Weight --Being extremely  underweight can increase your risk of osteoporosis.  Family history of osteoporosis--Having a family member who has developed osteoporosis can increase your risk. SYMPTOMS  Usually, people with osteoporosis have no symptoms.  DIAGNOSIS  Signs during a physical exam that may prompt your caregiver to suspect osteoporosis include:  Decreased height. This is usually caused by the compression of the bones that form your spine (vertebrae) because they have weakened and become fractured.  A curving or rounding of the upper back (kyphosis). To confirm signs of osteoporosis, your caregiver may request a procedure that uses 2 low-dose X-ray beams with different levels of energy to measure your bone mineral density (dual-energy X-ray absorptiometry [DXA]). Also, your caregiver may check your level of vitamin D. TREATMENT  The goal of osteoporosis treatment is to strengthen bones in order to decrease the risk of bone fractures. There are different types of medicines available to help achieve this goal. Some of these medicines work by slowing the processes of bone loss. Some medicines work by increasing bone density. Treatment also involves making sure that your levels of calcium and vitamin D are adequate. PREVENTION  There are things you can do to help prevent osteoporosis. Adequate intake of calcium and vitamin D can help you achieve optimal bone mineral density. Regular exercise can also help, especially resistance and weight-bearing activities. If you smoke, quitting smoking is an important part of osteoporosis prevention. MAKE SURE YOU:  Understand these instructions.  Will watch your condition.  Will get help right away if you are not doing well or get worse. FOR MORE INFORMATION www.osteo.org and www.nof.org Document Released: 07/23/2005 Document Revised: 02/07/2013 Document Reviewed: 09/27/2011 ExitCare Patient Information 2015 [employer]. This information is not   intended to replace advice  given to you by your health care provider. Make sure you discuss any questions you have with your health care provider.    Risedronate tablets What is this medicine? RISEDRONATE (ris ED roe nate) reduces calcium loss from bones. It helps make healthy bone and to slow bone loss in patients with Paget's disease and osteoporosis. It may be used in others at risk for bone loss. This medicine may be used for other purposes; ask your health care provider or pharmacist if you have questions. COMMON BRAND NAME(S): Actonel What should I tell my health care provider before I take this medicine? They need to know if you have any of these conditions: -dental disease -esophagus, stomach, or intestine problems, like acid reflux or GERD -kidney disease -low blood calcium -problems sitting or standing for 30 minutes -trouble swallowing -an unusual or allergic reaction to risedronate, other medicines, foods, dyes, or preservatives -pregnant or trying to get pregnant -breast-feeding How should I use this medicine? You must take this medication exactly as directed or you will lower the amount of medicine you absorb into your body or you may cause your self harm. Take this medicine by mouth first thing in the morning, after you are up for the day. Do not eat or drink anything before you take this medicine. Swallow the tablets with a full glass (6 to 8 fluid ounces) of plain water. Do not take the tablets with any other drink. Do not chew or crush the tablet. After taking this medicine, do not eat breakfast, drink, or take any other medicines or vitamins for at least 30 minutes. Stand or sit up for at least 30 minutes after you take this medicine; do not lie down. Do not take your medicine more often than directed. Talk to your pediatrician regarding the use of this medicine in children. Special care may be needed. Overdosage: If you think you have taken too much of this medicine contact a poison control center or  emergency room at once. NOTE: This medicine is only for you. Do not share this medicine with others. What if I miss a dose? If you miss a dose, do not take it later in the day. Take your normal dose the next morning. Do not take double or extra doses. What may interact with this medicine? -antacids like aluminum hydroxide or magnesium hydroxide -aspirin -calcium supplements -iron supplements -NSAIDs, medicines for pain and inflammation, like ibuprofen or naproxen -thyroid hormones -vitamins with minerals This list may not describe all possible interactions. Give your health care provider a list of all the medicines, herbs, non-prescription drugs, or dietary supplements you use. Also tell them if you smoke, drink alcohol, or use illegal drugs. Some items may interact with your medicine. What should I watch for while using this medicine? Visit your doctor or health care professional for regular check ups. It may be some time before you see the benefit from this medicine. Your doctor or health care professional may order blood tests and other tests to see how you are doing. You should make sure you get enough calcium and vitamin D while you are taking this medicine, unless your doctor tells you not to. Discuss the foods you eat and the vitamins you take with your health care professional. Some people who take this medicine have severe bone, joint, and/or muscle pain. This medicine may also increase your risk for a broken thigh bone. Tell your doctor right away if you have pain in your  upper leg or groin. Tell your doctor if you have any pain that does not go away or that gets worse. What side effects may I notice from receiving this medicine? Side effects that you should report to your doctor as soon as possible: -allergic reactions such as skin rash or itching, hives, swelling of the face, lips, throat, or tongue -black or tarry stools -changes in vision -heartburn or stomach pain -jaw pain,  especially after dental work -pain or difficulty when swallowing -redness, blistering, peeling, or loosening of the skin, including inside the mouth Side effects that usually do not require medical attention (report to your doctor if they continue or are bothersome): -bone, muscle, or joint pain -changes in taste -diarrhea or constipation -eye pain or itching -headache -nausea or vomiting -stomach gas or fullness This list may not describe all possible side effects. Call your doctor for medical advice about side effects. You may report side effects to FDA at 1-800-FDA-1088. Where should I keep my medicine? Keep out of the reach of children. Store at room temperature between 20 and 25 degrees C (68 and 77 degrees F). Throw away any unused medicine after the expiration date. NOTE: This sheet is a summary. It may not cover all possible information. If you have questions about this medicine, talk to your doctor, pharmacist, or health care provider.  2015, Elsevier/Gold Standard. (2012-09-03 16:21:37)  Raloxifene tablets What is this medicine? RALOXIFENE (ral OX i feen) reduces the amount of calcium lost from bones. It is used to treat and prevent osteoporosis in women who have experienced menopause. This medicine may be used for other purposes; ask your health care provider or pharmacist if you have questions. COMMON BRAND NAME(S): Evista What should I tell my health care provider before I take this medicine? They need to know if you have any of these conditions: -a history of blood clots -cancer -heart failure -liver disease -premenopausal -an unusual or allergic reaction to raloxifene, other medicines, foods, dyes, or preservatives -pregnant or trying to get pregnant -breast-feeding How should I use this medicine? Take this medicine by mouth with a glass of water. Follow the directions on the prescription label. The tablets can be taken with or without food. Take your doses at  regular intervals. Do not take your medicine more often than directed. Talk to your pediatrician regarding the use of this medicine in children. Special care may be needed. Overdosage: If you think you have taken too much of this medicine contact a poison control center or emergency room at once. NOTE: This medicine is only for you. Do not share this medicine with others. What if I miss a dose? If you miss a dose, take it as soon as you can. If it is almost time for your next dose, take only that dose. Do not take double or extra doses. What may interact with this medicine? -ampicillin -cholestyramine -colestipol -diazepam -diazoxide -female hormones like hormone replacement therapy -lidocaine -warfarin This list may not describe all possible interactions. Give your health care provider a list of all the medicines, herbs, non-prescription drugs, or dietary supplements you use. Also tell them if you smoke, drink alcohol, or use illegal drugs. Some items may interact with your medicine. What should I watch for while using this medicine? Visit your doctor or health care professional for regular checks on your progress. Do not stop taking this medicine except on the advice of your doctor or health care professional. You should make sure you get enough calcium  and vitamin D in your diet while you are taking this medicine. Discuss your dietary needs with your health care professional or nutritionist. Exercise may help to prevent bone loss. Discuss your exercise needs with your doctor or health care professional. This medicine can rarely cause blood clots. You should avoid long periods of bed rest while taking this medicine. If you are going to have surgery, tell your doctor or health care professional that you are taking this medicine. This medicine should be stopped at least 3 days before surgery. After surgery, it should be restarted only after you are walking again. It should not be restarted while  you still need long periods of bed rest. You should not smoke while taking this medicine. Smoking may also increase your risk of blood clots. Smoking can also decrease the effects of this medicine. This medicine does not prevent hot flashes. It may cause hot flashes in some patients at the start of therapy. What side effects may I notice from receiving this medicine? Side effects that you should report to your doctor or health care professional as soon as possible: -change in vision -chest pain -difficulty breathing -leg pain or swelling -skin rash, itching Side effects that usually do not require medical attention (report to your doctor or health care professional if they continue or are bothersome): -fluid build-up -leg cramps -stomach pain -sweating This list may not describe all possible side effects. Call your doctor for medical advice about side effects. You may report side effects to FDA at 1-800-FDA-1088. Where should I keep my medicine? Keep out of the reach of children. Store at room temperature between 15 and 30 degrees C (59 and 86 degrees F). Throw away any unused medicine after the expiration date. NOTE: This sheet is a summary. It may not cover all possible information. If you have questions about this medicine, talk to your doctor, pharmacist, or health care provider.  2015, Elsevier/Gold Standard. (2008-09-28 15:15:14)

## 2015-06-15 NOTE — Progress Notes (Signed)
Patient ID: Sabrina Hansen, female   DOB: 1956-04-24, 59 y.o.   MRN: [id number] GYNECOLOGY  VISIT   HPI: 59 y.o.   Married  Caucasian  female   G2P1 with Patient's last menstrual period was 05/27/2009.   here to discuss results of bone density study.     Bone density at [facility] 05/23/15 showing bone loss since 2011 and now dx of osteoporosis. T sore spine -0.5 Left hip -2.3 Right hip -2.5  Stopped HRT after two years of use. Stopped in December 2014.  Not a smoker.  No steroid use. ETOH:  2 every 30 days.  No malabsorptive syndrome. Normal TSH in February 2016.   Some difficulty swallowing large medications.  Mother and family members with osteoporosis. Mother with fracture history.  She takes Evista.  GYNECOLOGIC HISTORY: Patient's last menstrual period was 05/27/2009. Contraception:Postmenopausal Menopausal hormone therapy: None Last mammogram: 05-23-15 Bil.Diag.and [place] for follow up of complex cyst of Rt.Br./Density Cat.D/benign cyst Rt.Br./return to routine yearly evaluation:[facility] Last pap smear: 12-16-13 Neg:Neg        OB History    Gravida Para Term Preterm AB TAB SAB Ectopic Multiple Living   2 1        1          Patient Active Problem List   Diagnosis Date Noted  . Inguinal hernia 01/05/2014  . ETD (eustachian tube dysfunction) 06/23/2013  . Skin lesion 06/15/2012  . OSTEOPENIA 09/16/2010  . CHICKENPOX, HX OF 09/16/2010    Past Medical History  Diagnosis Date  . Osteopenia   . Family history of diabetes mellitus     1st degree relative  . Family history of alcoholism   . Endometriosis   . ASCUS on Pap smear 2006    Asc-us pap w/HRHPV  . Infertility, female   . Femoral hernia     right  . Breast cyst 1998 1999    2005 x3  . Trisomy 13 11/93    Sabrina Hansen    Past Surgical History  Procedure Laterality Date  . Laparoscopy  1990    endometriosis  . Breast biopsy and benign lump removal Left 1992    Breast Bx atypia (L)  . Abdominal endometriosis  1991     removal right endometrioma  . Bunionectomy  2011 and 2012  . Tonsillectomy    . Inguinal hernia repair  05/2014    Dr. Barkley Bruns    Current Outpatient Prescriptions  Medication Sig Dispense Refill  . Calcium Carbonate-Vitamin D (TH CALCIUM CARBONATE-VITAMIN D) 600-400 MG-UNIT per tablet Take 1 tablet by mouth daily.    . Multiple Vitamin (MULTIVITAMIN) tablet Take 1 tablet by mouth daily.     No current facility-administered medications for this visit.     ALLERGIES: Review of patient's allergies indicates no known allergies.  Family History  Problem Relation Age of Onset  . Arthritis Mother   . Hyperlipidemia Mother   . Hypertension Mother   . Parkinsonism Mother   . Heart disease Mother   . Arthritis Father   . Hyperlipidemia Father   . Hypertension Father   . Heart disease Father     CAD (6 CABG)  . Stroke Father   . Diabetes Sister   . Alcohol abuse Brother   . Arthritis Other   . Hypertension Other   . Heart disease Other     Social History   Social History  . Marital Status: Married    Spouse Name: N/A  . Number of Children: 1  .  Years of Education: N/A   Occupational History  . Paramedic   Social History Main Topics  . Smoking status: Never Smoker   . Smokeless tobacco: Never Used  . Alcohol Use: 0.6 oz/week    1 Standard drinks or equivalent per week     Comment: 1 glass of wine per month  . Drug Use: No  . Sexual Activity: No   Other Topics Concern  . 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  PHYSICAL EXAMINATION:    BP 120/76 mmHg  Pulse 64  Ht 5' 6.75" (1.695 m)  Wt 120 lb 3.2 oz (54.522 kg)  BMI 18.98 kg/m2  LMP  05/27/2009    General appearance: alert, cooperative and appears stated age   ASSESSMENT  Osteoporosis.  BMI 18 FH of osteoporosis.  Personal hx of fracture.   PLAN  Counseled regarding osteoporosis.  Will check PTH, vit D level. Dicussed tx options of bisphosphonates, Evista.  Dicussed calcitonin, Prolia, Reclast, Forteo.  I do not recommend restarting HRT. Discussed fall prevention.  Discussed weight bearing exercise, ca, vit D.  Repeat bone density in 2 years.  Patient will do her personal research and call back with decision about treatment.  Will give her a copy of her BMD and any labs she wants.   An After Visit Summary was printed and given to the patient.  __25____ minutes face to face time of which over 50% was spent in counseling.

## 2015-06-16 LAB — VITAMIN D 25 HYDROXY (VIT D DEFICIENCY, FRACTURES): VIT D 25 HYDROXY: 31 ng/mL (ref 30–100)

## 2015-06-18 LAB — PARATHYROID HORMONE, INTACT (NO CA): PTH: 26 pg/mL (ref 14–64)

## 2015-07-06 ENCOUNTER — Encounter: Payer: Self-pay | Admitting: Obstetrics and Gynecology

## 2015-07-08 ENCOUNTER — Other Ambulatory Visit: Payer: Self-pay | Admitting: Obstetrics and Gynecology

## 2015-07-08 MED ORDER — RISEDRONATE SODIUM 35 MG PO TABS: 35.0000 mg | ORAL_TABLET | ORAL | Status: DC

## 2015-08-28 ENCOUNTER — Ambulatory Visit (INDEPENDENT_AMBULATORY_CARE_PROVIDER_SITE_OTHER): Payer: 59

## 2015-08-28 DIAGNOSIS — Z23 Encounter for immunization: Secondary | ICD-10-CM | POA: Diagnosis not present

## 2015-12-25 ENCOUNTER — Other Ambulatory Visit: Payer: Self-pay | Admitting: Obstetrics and Gynecology

## 2015-12-25 NOTE — Telephone Encounter (Signed)
Medication refill request: Actonel  Last AEX:  12-22-14 Next AEX: 01-04-16 Last MMG (if hormonal medication request): 05-23-15 abnormal- U/S- WNL  Refill authorized: please advise

## 2016-01-04 ENCOUNTER — Ambulatory Visit (INDEPENDENT_AMBULATORY_CARE_PROVIDER_SITE_OTHER): Payer: 59 | Admitting: Obstetrics and Gynecology

## 2016-01-04 ENCOUNTER — Encounter: Payer: Self-pay | Admitting: Obstetrics and Gynecology

## 2016-01-04 VITALS — BP 122/70 | HR 70 | Resp 14 | Ht 66.5 in | Wt 121.6 lb

## 2016-01-04 DIAGNOSIS — R3129 Other microscopic hematuria: Secondary | ICD-10-CM

## 2016-01-04 DIAGNOSIS — Z01419 Encounter for gynecological examination (general) (routine) without abnormal findings: Secondary | ICD-10-CM

## 2016-01-04 DIAGNOSIS — M810 Age-related osteoporosis without current pathological fracture: Secondary | ICD-10-CM | POA: Diagnosis not present

## 2016-01-04 DIAGNOSIS — Z0000 Encounter for general adult medical examination without abnormal findings: Secondary | ICD-10-CM

## 2016-01-04 LAB — URINALYSIS, MICROSCOPIC ONLY
Bacteria, UA: NONE SEEN [HPF]
CASTS: NONE SEEN [LPF]
Crystals: NONE SEEN [HPF]
Squamous Epithelial / LPF: NONE SEEN [HPF] (ref <=5)
WBC, UA: NONE SEEN WBC/HPF (ref <=5)
YEAST: NONE SEEN [HPF]

## 2016-01-04 LAB — CBC
HEMATOCRIT: 38.1 % (ref 36.0–46.0)
HEMOGLOBIN: 12.8 g/dL (ref 12.0–15.0)
MCH: 29.4 pg (ref 26.0–34.0)
MCHC: 33.6 g/dL (ref 30.0–36.0)
MCV: 87.6 fL (ref 78.0–100.0)
MPV: 10.6 fL (ref 8.6–12.4)
Platelets: 258 10*3/uL (ref 150–400)
RBC: 4.35 MIL/uL (ref 3.87–5.11)
RDW: 13.4 % (ref 11.5–15.5)
WBC: 5.2 10*3/uL (ref 4.0–10.5)

## 2016-01-04 LAB — VITAMIN D 25 HYDROXY (VIT D DEFICIENCY, FRACTURES): VIT D 25 HYDROXY: 33 ng/mL (ref 30–100)

## 2016-01-04 LAB — LIPID PANEL
CHOLESTEROL: 190 mg/dL (ref 125–200)
HDL: 84 mg/dL (ref >=46)
LDL Cholesterol: 94 mg/dL (ref <130)
Total CHOL/HDL Ratio: 2.3 Ratio (ref <=5.0)
Triglycerides: 61 mg/dL (ref <150)
VLDL: 12 mg/dL (ref <30)

## 2016-01-04 LAB — COMPREHENSIVE METABOLIC PANEL
ALBUMIN: 4.2 g/dL (ref 3.6–5.1)
ALK PHOS: 31 U/L — AB (ref 33–130)
ALT: 13 U/L (ref 6–29)
AST: 15 U/L (ref 10–35)
BILIRUBIN TOTAL: 0.6 mg/dL (ref 0.2–1.2)
BUN: 11 mg/dL (ref 7–25)
CALCIUM: 9.5 mg/dL (ref 8.6–10.4)
CO2: 24 mmol/L (ref 20–31)
Chloride: 105 mmol/L (ref 98–110)
Creat: 0.83 mg/dL (ref 0.50–1.05)
Glucose, Bld: 86 mg/dL (ref 65–99)
Potassium: 4.8 mmol/L (ref 3.5–5.3)
Sodium: 139 mmol/L (ref 135–146)
Total Protein: 6.5 g/dL (ref 6.1–8.1)

## 2016-01-04 LAB — HEMOGLOBIN, FINGERSTICK: HEMOGLOBIN, FINGERSTICK: 12.1 g/dL (ref 12.0–16.0)

## 2016-01-04 LAB — TSH: TSH: 1.5 mIU/L

## 2016-01-04 MED ORDER — RISEDRONATE SODIUM 35 MG PO TABS: ORAL_TABLET | ORAL | Status: DC

## 2016-01-04 NOTE — Progress Notes (Signed)
Patient ID: Sabrina Hansen, female   DOB: 1956/06/25, 60 y.o.   MRN: [id number] 60 y.o. G2P1 Married Caucasian female here for annual exam.    On Actonel since September 2016.  Some bone pain, but not severe. Feels joints are creaking.  No dental issues.  No GI issues.   Exercising regularly.   Retired in April 2016.  Settled father's estate last year.  Brother deceased from drugs and ETOH.  Now settling his estate. Caring for her mother.  PCP:  Elsie Stain, MD    Patient's last menstrual period was 05/27/2009.          Sexually active: No. female The current method of family planning is post menopausal status.    Exercising: Yes.    aerobics, weights and yoga. Smoker:  no  Health Maintenance: Pap:  12-16-13 Neg:Neg HR HPV History of abnormal Pap:  Yes, 06/2005 Acus/Pos HR HPV, colposcopy 07-25-05 showed atypia on cervical bx and ECC negative. No Treatment to cervix and paps normal since. MMG: 05-23-15 Bil Diag and [place] for follow up of complex cyst of Rt.Br.--Den.D--benign cyst Rt.br.--return to routine yearly eval:[facility]   Colonoscopy:  07/2006 normal Dr. Rosanna Randy due 07/2016. BMD:   05-23-15  Result  Osteoporosis Rt.GT:[id number] TDaP:  12/10/12 Screening Labs:  Hb today: 12.1, Urine today: Mod.RBCs--asymptomatic.   reports that she has never smoked. She has never used smokeless tobacco. She reports that she drinks alcohol. She reports that she does not use illicit drugs.  Past Medical History  Diagnosis Date  . Osteopenia   . Family history of diabetes mellitus     1st degree relative  . Family history of alcoholism   . Endometriosis   . ASCUS on Pap smear 2006    Asc-us pap w/HRHPV  . Infertility, female   . Femoral hernia     right  . Breast cyst 1998 1999    2005 x3  . Trisomy 13 11/93    [name]  . Osteoporosis 2016    Past Surgical History  Procedure Laterality Date  . Laparoscopy  1990    endometriosis  . Breast biopsy and benign lump removal Left 1992   Breast Bx atypia (L)  . Abdominal endometriosis  1991    removal right endometrioma  . Bunionectomy  2011 and 2012  . Tonsillectomy    . Inguinal hernia repair  05/2014    Dr. Barkley Bruns    Current Outpatient Prescriptions  Medication Sig Dispense Refill  . Cholecalciferol (VITAMIN D3) 10000 units TABS Take 1 tablet by mouth daily.    . Multiple Vitamin (MULTIVITAMIN) tablet Take 1 tablet by mouth daily.    . risedronate (ACTONEL) 35 MG tablet TAKE 1 TABLET BY MOUTH EVERY 7 DAYS W/WATER ON EMPTY STOMACH NOTHING BY MOUTH OR LIE DOWN FOR 60MIN 4 tablet 0   No current facility-administered medications for this visit.    Family History  Problem Relation Age of Onset  . Arthritis Mother   . Hyperlipidemia Mother   . Hypertension Mother   . Parkinsonism Mother   . Heart disease Mother   . Arthritis Father   . Hyperlipidemia Father   . Hypertension Father   . Heart disease Father     CAD (6 CABG)  . Stroke Father   . Diabetes Sister   . Alcohol abuse Brother     dec 58 09/2015 drugs & alcohol  . Arthritis Other   . Hypertension Other   . Heart disease Other  ROS:  Pertinent items are noted in HPI.  Otherwise, a comprehensive ROS was negative.  Exam:   BP 122/70 mmHg  Pulse 70  Resp 14  Ht 5' 6.5" (1.689 m)  Wt 121 lb 9.6 oz (55.157 kg)  BMI 19.33 kg/m2  LMP 05/27/2009    General appearance: alert, cooperative and appears stated age Head: Normocephalic, without obvious abnormality, atraumatic Neck: no adenopathy, supple, symmetrical, trachea midline and thyroid normal to inspection and palpation Lungs: clear to auscultation bilaterally Breasts: normal appearance, no masses or tenderness, Inspection negative, No nipple retraction or dimpling, No nipple discharge or bleeding, No axillary or supraclavicular adenopathy Heart: regular rate and rhythm Abdomen: soft, non-tender; bowel sounds normal; no masses,  no organomegaly Extremities: extremities normal, atraumatic, no  cyanosis or edema Skin: Skin color, texture, turgor normal. No rashes or lesions Lymph nodes: Cervical, supraclavicular, and axillary nodes normal. No abnormal inguinal nodes palpated Neurologic: Grossly normal  Pelvic: External genitalia:  no lesions              Urethra:  normal appearing urethra with no masses, tenderness or lesions              Bartholins and Skenes: normal                 Vagina: normal appearing vagina with normal color and discharge, no lesions              Cervix: no lesions              Pap taken: No. Bimanual Exam:  Uterus:  normal size, contour, position, consistency, mobility, non-tender              Adnexa: normal adnexa and no mass, fullness, tenderness              Rectovaginal: Yes.  .  Confirms.              Anus:  normal sphincter tone, no lesions  Chaperone was present for exam.  Assessment:   Well woman visit with normal exam. Remote hx of abnormal pap.  Osteoporosis.  On Actonel.  Some bone pain.  Microscopic hematuria.   Plan: Yearly mammogram recommended after age 66.  Recommended self breast exam.  Pap and HR HPV as above.  Pap and HR HPV next year.  Discussed Calcium, Vitamin D, regular exercise program including cardiovascular and weight bearing exercise. Labs performed.  Yes.  .   See orders.  Routine labs and urine micro and culture.  Refills given on medications.  Yes.  .  See orders.  Actonel 35 mg daily.  Will monitor symptoms. Bone density in 2018. Follow up annually and prn.      After visit summary provided.

## 2016-01-04 NOTE — Patient Instructions (Signed)

## 2016-01-05 LAB — URINE CULTURE
Colony Count: NO GROWTH
Organism ID, Bacteria: NO GROWTH

## 2016-05-26 ENCOUNTER — Encounter: Payer: Self-pay | Admitting: Family Medicine

## 2016-05-29 ENCOUNTER — Encounter: Payer: Self-pay | Admitting: Obstetrics and Gynecology

## 2016-06-02 ENCOUNTER — Encounter: Payer: Self-pay | Admitting: Gastroenterology

## 2016-07-16 ENCOUNTER — Encounter: Payer: Self-pay | Admitting: Gastroenterology

## 2016-08-26 ENCOUNTER — Ambulatory Visit (AMBULATORY_SURGERY_CENTER): Payer: Self-pay

## 2016-08-26 VITALS — Ht 66.75 in | Wt 121.0 lb

## 2016-08-26 DIAGNOSIS — Z1211 Encounter for screening for malignant neoplasm of colon: Secondary | ICD-10-CM

## 2016-08-26 NOTE — Progress Notes (Signed)
"  Slow to wake up" from anesthesia per pt No allergies to eggs or soy No diet meds No home oxygen  Declined emmi

## 2016-08-28 ENCOUNTER — Ambulatory Visit (INDEPENDENT_AMBULATORY_CARE_PROVIDER_SITE_OTHER): Payer: 59

## 2016-08-28 DIAGNOSIS — Z23 Encounter for immunization: Secondary | ICD-10-CM

## 2016-09-09 ENCOUNTER — Encounter: Payer: 59 | Admitting: Gastroenterology

## 2016-09-29 ENCOUNTER — Ambulatory Visit (AMBULATORY_SURGERY_CENTER): Payer: Self-pay

## 2016-09-29 VITALS — Ht 66.75 in

## 2016-09-29 DIAGNOSIS — Z1211 Encounter for screening for malignant neoplasm of colon: Secondary | ICD-10-CM

## 2016-09-29 MED ORDER — SUPREP BOWEL PREP KIT 17.5-3.13-1.6 GM/177ML PO SOLN: 1.0000 | Freq: Once | ORAL | 0 refills | Status: AC

## 2016-09-29 NOTE — Progress Notes (Signed)
No allergies to eggs or soy No past problems with anesthesia except believes it takes her longer than "most people" No diet meds No home oxygen  Declined emmi

## 2016-11-11 ENCOUNTER — Telehealth: Payer: Self-pay | Admitting: Family Medicine

## 2016-11-11 MED ORDER — OSELTAMIVIR PHOSPHATE 75 MG PO CAPS: 75.0000 mg | ORAL_CAPSULE | Freq: Two times a day (BID) | ORAL | 0 refills | Status: DC

## 2016-11-11 NOTE — Telephone Encounter (Signed)
Sent.  Schedule CPE for her here when possible.  Hope they both feel better soon.  Thanks.

## 2016-11-11 NOTE — Telephone Encounter (Signed)
Left detailed message on voicemail.  

## 2016-11-11 NOTE — Telephone Encounter (Signed)
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  She is now starting to experience symptoms and had a fever of 102 last night.  She also has aches.  She would like tamiflu called into the [facility] in [place].  [phone] is her call back number.

## 2016-11-12 ENCOUNTER — Encounter: Payer: Self-pay | Admitting: Internal Medicine

## 2016-11-24 ENCOUNTER — Encounter: Payer: 59 | Admitting: Internal Medicine

## 2016-11-24 ENCOUNTER — Ambulatory Visit (AMBULATORY_SURGERY_CENTER): Payer: 59 | Admitting: Internal Medicine

## 2016-11-24 ENCOUNTER — Encounter: Payer: Self-pay | Admitting: Internal Medicine

## 2016-11-24 VITALS — BP 113/64 | HR 64 | Temp 98.7°F | Resp 11 | Ht 66.75 in | Wt 121.0 lb

## 2016-11-24 DIAGNOSIS — Z1212 Encounter for screening for malignant neoplasm of rectum: Secondary | ICD-10-CM

## 2016-11-24 DIAGNOSIS — Z1211 Encounter for screening for malignant neoplasm of colon: Secondary | ICD-10-CM | POA: Diagnosis present

## 2016-11-24 MED ORDER — SODIUM CHLORIDE 0.9 % IV SOLN: 500.0000 mL | INTRAVENOUS | Status: DC

## 2016-11-24 NOTE — Progress Notes (Signed)
No problems noted in the recovery room. maw 

## 2016-11-24 NOTE — Patient Instructions (Signed)
YOU HAD AN ENDOSCOPIC PROCEDURE TODAY AT [facility]:   Refer to the procedure report that was given to you for any specific questions about what was found during the examination.  If the procedure report does not answer your questions, please call your gastroenterologist to clarify.  If you requested that your care partner not be given the details of your procedure findings, then the procedure report has been included in a sealed envelope for you to review at your convenience later.  YOU SHOULD EXPECT: Some feelings of bloating in the abdomen. Passage of more gas than usual.  Walking can help get rid of the air that was put into your GI tract during the procedure and reduce the bloating. If you had a lower endoscopy (such as a colonoscopy or flexible sigmoidoscopy) you may notice spotting of blood in your stool or on the toilet paper. If you underwent a bowel prep for your procedure, you may not have a normal bowel movement for a few days.  Please Note:  You might notice some irritation and congestion in your nose or some drainage.  This is from the oxygen used during your procedure.  There is no need for concern and it should clear up in a day or so.  SYMPTOMS TO REPORT IMMEDIATELY:   Following lower endoscopy (colonoscopy or flexible sigmoidoscopy):  Excessive amounts of blood in the stool  Significant tenderness or worsening of abdominal pains  Swelling of the abdomen that is new, acute  Fever of 100F or higher   Following upper endoscopy (EGD)  Vomiting of blood or coffee ground material  New chest pain or pain under the shoulder blades  Painful or persistently difficult swallowing  New shortness of breath  Fever of 100F or higher  Black, tarry-looking stools  For urgent or emergent issues, a gastroenterologist can be reached at any hour by calling [phone].   DIET:  We do recommend a small meal at first, but then you may proceed to your regular diet.  Drink  plenty of fluids but you should avoid alcoholic beverages for 24 hours.  ACTIVITY:  You should plan to take it easy for the rest of today and you should NOT DRIVE or use heavy machinery until tomorrow (because of the sedation medicines used during the test).    FOLLOW UP: Our staff will call the number listed on your records the next business day following your procedure to check on you and address any questions or concerns that you may have regarding the information given to you following your procedure. If we do not reach you, we will leave a message.  However, if you are feeling well and you are not experiencing any problems, there is no need to return our call.  We will assume that you have returned to your regular daily activities without incident.  If any biopsies were taken you will be contacted by phone or by letter within the next 1-3 weeks.  Please call us at [phone] if you have not heard about the biopsies in 3 weeks.    SIGNATURES/CONFIDENTIALITY: You and/or your care partner have signed paperwork which will be entered into your electronic medical record.  These signatures attest to the fact that that the information above on your After Visit Summary has been reviewed and is understood.  Full responsibility of the confidentiality of this discharge information lies with you and/or your care-partner.     You may resume your current medications today. Repeat  colonoscopy in 10 years for screening purposes. Please call if any questions or concerns.

## 2016-11-24 NOTE — Op Note (Signed)
[facility] Patient Name: Sabrina Hansen Procedure Date: 11/24/2016 10:36 AM MRN: [id number] Endoscopist: Jerene Bears , MD Age: 61 Referring MD:  Date of Birth: 1956/10/04 Gender: Female Account #: [id number] Procedure:                Colonoscopy Indications:              Screening for colorectal malignant neoplasm, Last                            colonoscopy 10 years ago Medicines:                Monitored Anesthesia Care Procedure:                Pre-Anesthesia Assessment:                           - Prior to the procedure, a History and Physical                            was performed, and patient medications and                            allergies were reviewed. The patient's tolerance of                            previous anesthesia was also reviewed. The risks                            and benefits of the procedure and the sedation                            options and risks were discussed with the patient.                            All questions were answered, and informed consent                            was obtained. Prior Anticoagulants: The patient has                            taken no previous anticoagulant or antiplatelet                            agents. ASA Grade Assessment: II - A patient with                            mild systemic disease. After reviewing the risks                            and benefits, the patient was deemed in                            satisfactory condition to undergo the procedure.  After obtaining informed consent, the colonoscope                            was passed under direct vision. Throughout the                            procedure, the patient's blood pressure, pulse, and                            oxygen saturations were monitored continuously. The                            Model PCF-H190DL [id number]) scope was introduced                            through the anus and advanced to  the the cecum,                            identified by appendiceal orifice and ileocecal                            valve. The colonoscopy was performed without                            difficulty. The patient tolerated the procedure                            well. The quality of the bowel preparation was                            good. The ileocecal valve, appendiceal orifice, and                            rectum were photographed. Scope In: 10:45:12 AM Scope Out: 11:01:56 AM Scope Withdrawal Time: 0 hours 9 minutes 30 seconds  Total Procedure Duration: 0 hours 16 minutes 44 seconds  Findings:                 The perianal and digital rectal examinations were                            normal.                           The entire examined colon appeared normal on direct                            and retroflexion views. Complications:            No immediate complications. Estimated Blood Loss:     Estimated blood loss: none. Impression:               - The entire examined colon is normal on direct and                            retroflexion views.                           -  No specimens collected. Recommendation:           - Patient has a contact number available for                            emergencies. The signs and symptoms of potential                            delayed complications were discussed with the                            patient. Return to normal activities tomorrow.                            Written discharge instructions were provided to the                            patient.                           - Resume previous diet.                           - Continue present medications.                           - Repeat colonoscopy in 10 years for screening                            purposes. Jerene Bears, MD 11/24/2016 11:04:03 AM This report has been signed electronically.

## 2016-11-24 NOTE — Progress Notes (Signed)
Spontaneous respirations throughout. VSS. Resting comfortably. To PACU on room air. Report to  Annette RN.  

## 2016-11-25 ENCOUNTER — Telehealth: Payer: Self-pay | Admitting: *Deleted

## 2016-11-25 NOTE — Telephone Encounter (Signed)
  Follow up Call-  Call back number 11/24/2016  Post procedure Call Back phone  # [phone]  Permission to leave phone message Yes  Some recent data might be hidden     Patient questions:  Do you have a fever, pain , or abdominal swelling? No. Pain Score  0 *  Have you tolerated food without any problems? Yes.    Have you been able to return to your normal activities? Yes.    Do you have any questions about your discharge instructions: Diet   No. Medications  Yes.  Patient stated that the dosage on her vitamin D was incorrect. Its 1000 units 2 tabs daily. Follow up visit  No.  Do you have questions or concerns about your Care? No.  Actions: * If pain score is 4 or above: No action needed, pain <4.

## 2016-12-01 ENCOUNTER — Other Ambulatory Visit: Payer: Self-pay | Admitting: Family Medicine

## 2016-12-01 DIAGNOSIS — Z8249 Family history of ischemic heart disease and other diseases of the circulatory system: Secondary | ICD-10-CM

## 2016-12-01 DIAGNOSIS — M810 Age-related osteoporosis without current pathological fracture: Secondary | ICD-10-CM

## 2016-12-09 ENCOUNTER — Other Ambulatory Visit (INDEPENDENT_AMBULATORY_CARE_PROVIDER_SITE_OTHER): Payer: 59

## 2016-12-09 DIAGNOSIS — M810 Age-related osteoporosis without current pathological fracture: Secondary | ICD-10-CM

## 2016-12-09 DIAGNOSIS — Z114 Encounter for screening for human immunodeficiency virus [HIV]: Secondary | ICD-10-CM

## 2016-12-09 DIAGNOSIS — Z1159 Encounter for screening for other viral diseases: Secondary | ICD-10-CM | POA: Diagnosis not present

## 2016-12-09 DIAGNOSIS — Z8249 Family history of ischemic heart disease and other diseases of the circulatory system: Secondary | ICD-10-CM

## 2016-12-09 LAB — COMPREHENSIVE METABOLIC PANEL
ALBUMIN: 4.2 g/dL (ref 3.5–5.2)
ALT: 13 U/L (ref 0–35)
AST: 16 U/L (ref 0–37)
Alkaline Phosphatase: 31 U/L — ABNORMAL LOW (ref 39–117)
BUN: 12 mg/dL (ref 6–23)
CALCIUM: 9.8 mg/dL (ref 8.4–10.5)
CHLORIDE: 106 meq/L (ref 96–112)
CO2: 30 mEq/L (ref 19–32)
Creatinine, Ser: 0.72 mg/dL (ref 0.40–1.20)
GFR: 87.66 mL/min (ref >60.00)
Glucose, Bld: 91 mg/dL (ref 70–99)
Potassium: 4.5 mEq/L (ref 3.5–5.1)
SODIUM: 141 meq/L (ref 135–145)
Total Bilirubin: 0.5 mg/dL (ref 0.2–1.2)
Total Protein: 6.5 g/dL (ref 6.0–8.3)

## 2016-12-09 LAB — LIPID PANEL
CHOLESTEROL: 205 mg/dL — AB (ref 0–200)
HDL: 77.6 mg/dL (ref >39.00)
LDL CALC: 115 mg/dL — AB (ref 0–99)
NonHDL: 127.64
TRIGLYCERIDES: 65 mg/dL (ref 0.0–149.0)
Total CHOL/HDL Ratio: 3
VLDL: 13 mg/dL (ref 0.0–40.0)

## 2016-12-10 LAB — HEPATITIS C ANTIBODY: HCV Ab: NEGATIVE

## 2016-12-10 LAB — HIV ANTIBODY (ROUTINE TESTING W REFLEX): HIV 1&2 Ab, 4th Generation: NONREACTIVE

## 2016-12-10 LAB — VITAMIN D 25 HYDROXY (VIT D DEFICIENCY, FRACTURES): VITD: 31.72 ng/mL (ref 30.00–100.00)

## 2016-12-13 ENCOUNTER — Other Ambulatory Visit: Payer: Self-pay | Admitting: Obstetrics and Gynecology

## 2016-12-15 NOTE — Telephone Encounter (Signed)
Medication refill request: Actonel  Last AEX:  01-04-16  Next AEX: 01-21-17 Last MMG (if hormonal medication request): 05-23-16 WNL  Refill authorized: please advise

## 2016-12-15 NOTE — Telephone Encounter (Signed)
Medication refill request: actonel  Last AEX:  01/04/16 Dr. Quincy Simmonds  Next AEX: 01/21/17 Dr. Quincy Simmonds  Last MMG (if hormonal medication request): 05/23/16 BIRADS2:Benign  Refill authorized: 01/04/16 #[id number]. Today please advise.

## 2016-12-16 ENCOUNTER — Ambulatory Visit (INDEPENDENT_AMBULATORY_CARE_PROVIDER_SITE_OTHER): Payer: 59 | Admitting: Family Medicine

## 2016-12-16 ENCOUNTER — Encounter: Payer: Self-pay | Admitting: Family Medicine

## 2016-12-16 VITALS — BP 102/68 | HR 78 | Temp 98.6°F | Ht 67.0 in | Wt 119.5 lb

## 2016-12-16 DIAGNOSIS — Z0000 Encounter for general adult medical examination without abnormal findings: Secondary | ICD-10-CM

## 2016-12-16 DIAGNOSIS — M810 Age-related osteoporosis without current pathological fracture: Secondary | ICD-10-CM

## 2016-12-16 DIAGNOSIS — Z7189 Other specified counseling: Secondary | ICD-10-CM

## 2016-12-16 NOTE — Progress Notes (Signed)
Pre visit review using our clinic review tool, if applicable. No additional management support is needed unless otherwise documented below in the visit note. 

## 2016-12-16 NOTE — Patient Instructions (Addendum)
Please call back in May and see if we have the new shingles shot in.   Take care.  Glad to see you.  Update [place] as needed.

## 2016-12-16 NOTE — Progress Notes (Signed)
CPE- See plan.  Routine anticipatory guidance given to patient.  See health maintenance. Tetanus 2014 Flu 2017 PNA not due yet, d/w pt.  Shingles d/w pt.  See AVS.   Colonoscopy 2018 Living will d/w pt.  Husband designated if patient were incapacitated.   Diet and exercise d/w pt.  She is working on both.   Pap per gyn clinic.   Pap due this year, d/w pt.  Mammogram per gyn clinic.    Osteoporosis:  ACTONEL per outside MD.  D/w pt.  She has f/u DXA per gyn clinic.  Some heartburn but tolerable.    Her mother was in the hospital with the flu (and parkinson's).  D/w pt.  Pt was able to take tamiflu and she got better.    PMH and SH reviewed  Meds, vitals, and allergies reviewed.   ROS: Per HPI.  Unless specifically indicated otherwise in HPI, the patient denies:  General: fever. Eyes: acute vision changes ENT: sore throat Cardiovascular: chest pain Respiratory: SOB GI: vomiting GU: dysuria Musculoskeletal: acute back pain Derm: acute rash Neuro: acute motor dysfunction Psych: worsening mood Endocrine: polydipsia Heme: bleeding Allergy: hayfever  GEN: nad, alert and oriented HEENT: mucous membranes moist NECK: supple w/o LA CV: rrr. PULM: ctab, no inc wob ABD: soft, +bs EXT: no edema SKIN: no acute rash

## 2016-12-17 DIAGNOSIS — Z0000 Encounter for general adult medical examination without abnormal findings: Secondary | ICD-10-CM | POA: Insufficient documentation

## 2016-12-17 DIAGNOSIS — Z7189 Other specified counseling: Secondary | ICD-10-CM | POA: Insufficient documentation

## 2016-12-17 NOTE — Assessment & Plan Note (Signed)
Living will d/w pt.  Husband designated if patient were incapacitated.  

## 2016-12-17 NOTE — Assessment & Plan Note (Signed)
Tetanus 2014 Flu 2017 PNA not due yet, d/w pt.  Shingles d/w pt.  See AVS.   Colonoscopy 2018 Living will d/w pt.  Husband designated if patient were incapacitated.   Diet and exercise d/w pt.  She is working on both.   Pap per gyn clinic.   Pap due this year, d/w pt.  Mammogram per gyn clinic.

## 2016-12-17 NOTE — Assessment & Plan Note (Signed)
ACTONEL per outside MD.  D/w pt.  She has f/u DXA per gyn clinic.  Some heartburn but tolerable.

## 2016-12-30 ENCOUNTER — Telehealth: Payer: Self-pay | Admitting: *Deleted

## 2016-12-30 ENCOUNTER — Encounter: Payer: Self-pay | Admitting: Obstetrics and Gynecology

## 2016-12-30 NOTE — Telephone Encounter (Signed)
Message sent to patient through My Chart.  Encounter closed.

## 2016-12-30 NOTE — Telephone Encounter (Signed)
Dr. Quincy Simmonds, please review MyChart message below and advise. Last BMD 05/23/15.  From Salley Scarlet To Nunzio Cobbs, MD Sent 12/30/2016 10:22 AM  Dr. Quincy Simmonds,  I have been taking Risedronate Sodium 35 MG once a week since 07/16/15. Our treatment plan is to take this medication for two years and then to schedule another bone density scan. Recently, I have experienced heartburn along with the feeling that it is harder for food to "go down." I am also experiencing some lower back pain as well as pain in joints. In reading the literature that comes with my prescription, these side effects are sometimes associated with this medication. If this is the cause of my esophagus problems and bone/joint pain, then I am concerned that it will worsen if I continue the Risedronate. I have an appointment with you for my annual check-up on 01/21/17 and would like to discuss these issues. However, I am wondering if I should stop the medication now to see if the problems subside.  I would appreciate your advice.    Thanks,  Sabrina Hansen

## 2016-12-30 NOTE — Telephone Encounter (Signed)
See telephone encounter dated 12/30/16.

## 2017-01-19 NOTE — Progress Notes (Signed)
61 y.o. G2P1 Married Caucasian female here for annual exam.    Stopped Actonel on March 5 due to difficulty with swallowing.  Took OTC Pepcid and had relief.  Also had joint pain.   Wants her iron level checked. Has had anemia in the past.   Mother had Parkinson's disease and got the flu.  Patient took Tamiflu.   PCP: Elsie Stain, MD  Patient's last menstrual period was 05/27/2009.           Sexually active: No. female The current method of family planning is post menopausal status.    Exercising: Yes.    Aerobics, weights and yoga Smoker:  no  Health Maintenance: Pap: 12-16-13 Neg:Neg HR HPV; 11-01-10 Neg History of abnormal Pap:  Yes,  06/2005 Acus/Pos HR HPV, colposcopy 07-25-05 showed atypia on cervical bx and ECC negative. No Treatment to cervix and paps normal since. MMG: 05-03-16 3D/Density C/Benign vascular calcifications in both breasts/Neg/BiRads2:[facility] Colonoscopy: 11-24-16 normal with Dr.Jay Pyrtle;next due 10/2026. BMD:  05-23-15  Result:Osteoporosis Rt.hip - 2.5, T score spine -0.5,Lt.hip -0.5.--WAS ON ACTONEL BUT STOPPED 12/2016 DUE TO HEARTBURN AND DYSPHASIA. TDaP:  11-30-12 Gardasil:   N/A HIV: NR  12/09/16. Hep C:  Neg 12/09/16. Screening Labs:  Hb today: PCP, Urine today: not done   reports that she has never smoked. She has never used smokeless tobacco. She reports that she drinks alcohol. She reports that she does not use drugs.  Past Medical History:  Diagnosis Date  . Anemia   . ASCUS on Pap smear 2006   Asc-us pap w/HRHPV  . Breast cyst 1998 1999   2005 x3  . Endometriosis   . Family history of alcoholism   . Family history of diabetes mellitus    1st degree relative  . Femoral hernia    right  . Infertility, female   . Osteoporosis 2016   started actonel 2016    Past Surgical History:  Procedure Laterality Date  . Abdominal endometriosis  1991   removal right endometrioma  . Breast biopsy and benign lump removal Left 1992   Breast Bx atypia (L)   . BUNIONECTOMY  2011 and 2012  . COLONOSCOPY    . INGUINAL HERNIA REPAIR  05/2014   Dr. Barkley Bruns  . LAPAROSCOPY  1990   endometriosis  . TONSILLECTOMY      Current Outpatient Prescriptions  Medication Sig Dispense Refill  . Calcium Carb-Cholecalciferol (CALTRATE 600+D) 600-800 MG-UNIT TABS Take by mouth 2 (two) times daily.    . cholecalciferol (VITAMIN D) 1000 units tablet Take 1,000 Units by mouth 2 (two) times daily.    . Multiple Vitamin (MULTIVITAMIN) tablet Take 1 tablet by mouth daily.     Current Facility-Administered Medications  Medication Dose Route Frequency Provider Last Rate Last Dose  . 0.9 %  sodium chloride infusion  500 mL Intravenous Continuous Jerene Bears, MD        Family History  Problem Relation Age of Onset  . Arthritis Mother   . Hyperlipidemia Mother   . Hypertension Mother   . Parkinsonism Mother   . Heart disease Mother   . Arthritis Father   . Hyperlipidemia Father   . Hypertension Father   . Heart disease Father     CAD (6 CABG)  . Stroke Father   . Diabetes Sister   . Alcohol abuse Brother     dec 58 09/2015 drugs & alcohol  . Arthritis Other   . Hypertension Other   . Heart  disease Other   . Colon cancer Neg Hx   . Breast cancer Neg Hx     ROS:  Pertinent items are noted in HPI.  Otherwise, a comprehensive ROS was negative.  Exam:   BP 118/62 (BP Location: Right Arm, Patient Position: Sitting, Cuff Size: Normal)   Pulse 76   Resp 14   Ht 5' 6.5" (1.689 m)   Wt 120 lb 6.4 oz (54.6 kg)   LMP 05/27/2009   BMI 19.14 kg/m     General appearance: alert, cooperative and appears stated age Head: Normocephalic, without obvious abnormality, atraumatic Neck: no adenopathy, supple, symmetrical, trachea midline and thyroid normal to inspection and palpation Lungs: clear to auscultation bilaterally Breasts: normal appearance, no masses or tenderness, No nipple retraction or dimpling, No nipple discharge or bleeding, No axillary or  supraclavicular adenopathy Heart: regular rate and rhythm Abdomen: soft, non-tender; no masses, no organomegaly Extremities: extremities normal, atraumatic, no cyanosis or edema Skin: Skin color, texture, turgor normal. No rashes or lesions Lymph nodes: Cervical, supraclavicular, and axillary nodes normal. No abnormal inguinal nodes palpated Neurologic: Grossly normal  Pelvic: External genitalia:  no lesions              Urethra:  normal appearing urethra with no masses, tenderness or lesions              Bartholins and Skenes: normal                 Vagina: normal appearing vagina with normal color and discharge, no lesions              Cervix: no lesions              Pap taken: Yes.   Bimanual Exam:  Uterus:  normal size, contour, position, consistency, mobility, non-tender              Adnexa: no mass, fullness, tenderness              Rectal exam: Yes.  .  Confirms.              Anus:  normal sphincter tone, no lesions  Chaperone was present for exam.  Assessment:   Well woman visit with normal exam. Remote hx abnormal pap.  Osteoporosis.  Intolerance to Actonel.  Hx anemia.   Plan: Mammogram screening discussed. Recommended self breast awareness. Pap and HR HPV as above. Guidelines for Calcium, Vitamin D, regular exercise program including cardiovascular and weight bearing exercise. CBC today.  Possible iron level to follow if anemic. She will pursue shingles vaccination.  BMD at [facility].  Will try to do prior to 2 years if possible. We will fax an order.  Follow up annually and prn.      After visit summary provided.

## 2017-01-21 ENCOUNTER — Ambulatory Visit (INDEPENDENT_AMBULATORY_CARE_PROVIDER_SITE_OTHER): Payer: 59 | Admitting: Obstetrics and Gynecology

## 2017-01-21 ENCOUNTER — Encounter: Payer: Self-pay | Admitting: Obstetrics and Gynecology

## 2017-01-21 VITALS — BP 118/62 | HR 76 | Resp 14 | Ht 66.5 in | Wt 120.4 lb

## 2017-01-21 DIAGNOSIS — M810 Age-related osteoporosis without current pathological fracture: Secondary | ICD-10-CM

## 2017-01-21 DIAGNOSIS — Z01419 Encounter for gynecological examination (general) (routine) without abnormal findings: Secondary | ICD-10-CM

## 2017-01-21 LAB — CBC
HCT: 39.3 % (ref 35.0–45.0)
Hemoglobin: 13.1 g/dL (ref 11.7–15.5)
MCH: 30.4 pg (ref 27.0–33.0)
MCHC: 33.3 g/dL (ref 32.0–36.0)
MCV: 91.2 fL (ref 80.0–100.0)
MPV: 10.2 fL (ref 7.5–12.5)
PLATELETS: 275 10*3/uL (ref 140–400)
RBC: 4.31 MIL/uL (ref 3.80–5.10)
RDW: 13.6 % (ref 11.0–15.0)
WBC: 6.3 10*3/uL (ref 3.8–10.8)

## 2017-01-21 NOTE — Patient Instructions (Signed)

## 2017-01-23 LAB — IPS PAP TEST WITH HPV

## 2017-02-13 DIAGNOSIS — M810 Age-related osteoporosis without current pathological fracture: Secondary | ICD-10-CM | POA: Diagnosis not present

## 2017-02-13 LAB — HM DEXA SCAN

## 2017-02-24 ENCOUNTER — Telehealth: Payer: Self-pay | Admitting: *Deleted

## 2017-02-24 ENCOUNTER — Encounter: Payer: Self-pay | Admitting: Obstetrics and Gynecology

## 2017-02-24 NOTE — Telephone Encounter (Signed)
Dr. Quincy Simmonds, have you received BMD results form [facility]?   From Salley Scarlet To Nunzio Cobbs, MD Sent 02/24/2017 10:57 AM  Dr. Quincy Simmonds,  After our discussion regarding treatment options for bone health at my annual visit on 01/21/17, you recommended a bone density scan to establish current bone status and to guide next steps. Prior to my March visit with you, I had been exercising regularly and taking Risedronate weekly for about 18 months. However, I had begun to experience gastrointestinal side effects and you had advised me to stop taking that medication.   I did have the bone density scan on 02/13/17 at [facility] and asked that the scan results be sent to you and to my primary care doctor. [facility] advised that my test results have been sent, so I am following up to make sure they were received by your office and to find out what the scan showed. Many thanks!   Best regards,  Sabrina Hansen

## 2017-02-25 NOTE — Telephone Encounter (Signed)
BMD received.  Right femur with osteoporosis, and looks a little worse from 2016. Left femur with osteopenia, and looks a little better from 2016.  Spine normal but did loose a little bone mass from 2016.   I am recommending a PTH with calcium to look for underlying causes of the osteoporosis.  I would consider Prolia for treatment of osteoporosis.

## 2017-02-25 NOTE — Telephone Encounter (Signed)
Left message to call Sabrina Hansen at [phone].  

## 2017-02-25 NOTE — Telephone Encounter (Signed)
Spoke with patient, advised of results and recommendations as seen below per Dr. Quincy Simmonds. Patient scheduled for OV to discuss results and recommendations on 02/27/17 at 10:15am. Patient verbalizes understanding and is agreeable.  Routing to provider for final review. Patient is agreeable to disposition. Will close encounter.

## 2017-02-26 ENCOUNTER — Encounter: Payer: Self-pay | Admitting: Obstetrics and Gynecology

## 2017-02-27 ENCOUNTER — Ambulatory Visit (INDEPENDENT_AMBULATORY_CARE_PROVIDER_SITE_OTHER): Payer: 59 | Admitting: Obstetrics and Gynecology

## 2017-02-27 ENCOUNTER — Encounter: Payer: Self-pay | Admitting: Obstetrics and Gynecology

## 2017-02-27 VITALS — BP 100/64 | HR 70 | Ht 66.5 in | Wt 121.8 lb

## 2017-02-27 DIAGNOSIS — M810 Age-related osteoporosis without current pathological fracture: Secondary | ICD-10-CM | POA: Diagnosis not present

## 2017-02-27 NOTE — Progress Notes (Signed)
GYNECOLOGY  VISIT   HPI: 61 y.o.   Married  Caucasian  female   G2P1 with Patient's last menstrual period was 05/27/2009.Here to discuss bone density results.  BMD at [facility] on 02/13/17: T score of right femur: -2.7 (was -2.6). T score of left femur: -2.3 (was -2.5). Spine: -0.8 (was -0.4)  Used Actonel and had GI issues - difficulty swallowing and dyspepsia.   Normal PTH of 26 on 06/15/15. Vit D 31.72 on 12/09/16.  Rare use of ETOH.  Not a smoker.   Hx of spiral fracture of foot.  Fell on a suitcase, got twisted in the strap.  GYNECOLOGIC HISTORY: Patient's last menstrual period was 05/27/2009. Contraception: Postmenopausal  Menopausal hormone therapy:  none Last mammogram:  05-03-16 3D/Density C/Benign vascular calcifications in both breasts/Neg/BiRads2:[facility] Last pap smear: 01-21-17 Neg:Neg HR HPV; 12-16-13 Neg:Neg HR HPV        OB History    Gravida Para Term Preterm AB Living   2 1       1    SAB TAB Ectopic Multiple Live Births                     Patient Active Problem List   Diagnosis Date Noted  . Routine general medical examination at a health care facility 12/17/2016  . Advance care planning 12/17/2016  . Osteoporosis 09/16/2010    Past Medical History:  Diagnosis Date  . Anemia   . ASCUS on Pap smear 2006   Asc-us pap w/HRHPV  . Breast cyst 1998 1999   2005 x3  . Endometriosis   . Family history of alcoholism   . Family history of diabetes mellitus    1st degree relative  . Femoral hernia    right  . Infertility, female   . Osteoporosis 2016   started actonel 2016    Past Surgical History:  Procedure Laterality Date  . Abdominal endometriosis  1991   removal right endometrioma  . Breast biopsy and benign lump removal Left 1992   Breast Bx atypia (L)  . BUNIONECTOMY  2011 and 2012  . COLONOSCOPY    . INGUINAL HERNIA REPAIR  05/2014   Dr. Barkley Bruns  . LAPAROSCOPY  1990   endometriosis  . TONSILLECTOMY      Current Outpatient Prescriptions   Medication Sig Dispense Refill  . Calcium Carb-Cholecalciferol (CALTRATE 600+D) 600-800 MG-UNIT TABS Take by mouth 2 (two) times daily.    . cholecalciferol (VITAMIN D) 1000 units tablet Take 1,000 Units by mouth 2 (two) times daily.    . Multiple Vitamin (MULTIVITAMIN) tablet Take 1 tablet by mouth daily.     Current Facility-Administered Medications  Medication Dose Route Frequency Provider Last Rate Last Dose  . 0.9 %  sodium chloride infusion  500 mL Intravenous Continuous Jerene Bears, MD         ALLERGIES: Patient has no known allergies.  Family History  Problem Relation Age of Onset  . Arthritis Mother   . Hyperlipidemia Mother   . Hypertension Mother   . Parkinsonism Mother   . Heart disease Mother   . Arthritis Father   . Hyperlipidemia Father   . Hypertension Father   . Heart disease Father     CAD (6 CABG)  . Stroke Father   . Diabetes Sister   . Alcohol abuse Brother     dec 58 09/2015 drugs & alcohol  . Arthritis Other   . Hypertension Other   . Heart disease Other   .  Colon cancer Neg Hx   . Breast cancer Neg Hx     Social History   Social History  . Marital status: Married    Spouse name: N/A  . Number of children: 1  . Years of education: N/A   Occupational History  . Paramedic   Social History Main Topics  . Smoking status: Never Smoker  . Smokeless tobacco: Never Used  . Alcohol use 0.0 oz/week     Comment: 1-2 glass of wine per month  . Drug use: No  . Sexual activity: No   Other Topics Concern  . 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  PHYSICAL EXAMINATION:    BP 100/64 (BP Location: Right Arm, Patient  Position: Sitting, Cuff Size: Normal)   Pulse 70   Ht 5' 6.5" (1.689 m)   Wt 121 lb 12.8 oz (55.2 kg)   LMP 05/27/2009   BMI 19.36 kg/m     General appearance: alert, cooperative and appears stated age  ASSESSMENT  Osteoporosis.  Intolerance to Actonel.  PLAN  We discussed osteoporosis, risk factors, risk of fracture, and tx options.  Tx choices may include Prolia, Evista, Reclast.  Written information about Prolia and Evista.  Patient will consider her options and call back.  Continue weight bearing exercise, Ca, vit D.  Recheck  BMD in 2 years.  An After Visit Summary was printed and given to the patient.  ___25______ minutes face to face time of which over 50% was spent in counseling.

## 2017-02-27 NOTE — Patient Instructions (Addendum)
Osteoporosis Osteoporosis is the thinning and loss of density in the bones. Osteoporosis makes the bones more brittle, fragile, and likely to break (fracture). Over time, osteoporosis can cause the bones to become so weak that they fracture after a simple fall. The bones most likely to fracture are the bones in the hip, wrist, and spine. What are the causes? The exact cause is not known. What increases the risk? Anyone can develop osteoporosis. You may be at greater risk if you have a family history of the condition or have poor nutrition. You may also have a higher risk if you are:  Female.  61 years old or older.  A smoker.  Not physically active.  White or Asian.  Slender. What are the signs or symptoms? A fracture might be the first sign of the disease, especially if it results from a fall or injury that would not usually cause a bone to break. Other signs and symptoms include:  Low back and neck pain.  Stooped posture.  Height loss. How is this diagnosed? To make a diagnosis, your health care provider may:  Take a medical history.  Perform a physical exam.  Order tests, such as:  A bone mineral density test.  A dual-energy X-ray absorptiometry test. How is this treated? The goal of osteoporosis treatment is to strengthen your bones to reduce your risk of a fracture. Treatment may involve:  Making lifestyle changes, such as:  Eating a diet rich in calcium.  Doing weight-bearing and muscle-strengthening exercises.  Stopping tobacco use.  Limiting alcohol intake.  Taking medicine to slow the process of bone loss or to increase bone density.  Monitoring your levels of calcium and vitamin D. Follow these instructions at home:  Include calcium and vitamin D in your diet. Calcium is important for bone health, and vitamin D helps the body absorb calcium.  Perform weight-bearing and muscle-strengthening exercises as directed by your health care provider.  Do  not use any tobacco products, including cigarettes, chewing tobacco, and electronic cigarettes. If you need help quitting, ask your health care provider.  Limit your alcohol intake.  Take medicines only as directed by your health care provider.  Keep all follow-up visits as directed by your health care provider. This is important.  Take precautions at home to lower your risk of falling, such as:  Keeping rooms well lit and clutter free.  Installing safety rails on stairs.  Using rubber mats in the bathroom and other areas that are often wet or slippery. Get help right away if: You fall or injure yourself. This information is not intended to replace advice given to you by your health care provider. Make sure you discuss any questions you have with your health care provider. Document Released: 07/23/2005 Document Revised: 03/17/2016 Document Reviewed: 03/23/2014 Elsevier Interactive Patient Education  2017 [employer].   Raloxifene tablets What is this medicine? RALOXIFENE (ral OX i feen) reduces the amount of calcium lost from bones. It is used to treat and prevent osteoporosis in women who have experienced menopause. It may also help prevent invasive breast cancer in certain women who have a high risk for breast cancer. This medicine may be used for other purposes; ask your health care provider or pharmacist if you have questions. COMMON BRAND NAME(S): Evista What should I tell my health care provider before I take this medicine? They need to know if you have any of these conditions: -a history of blood clots -cancer -heart disease or recent heart attack -  high levels of triglycerides (blood fat) in the blood -history of stroke -kidney disease -liver disease -premenopausal -smoke tobacco -an unusual or allergic reaction to raloxifene, other medicines, foods, dyes, or preservatives -pregnant or trying to get pregnant -breast-feeding How should I use this medicine? Take this  medicine by mouth with a glass of water. Follow the directions on the prescription label. The tablets can be taken with or without food. Take your doses at regular intervals. Do not take your medicine more often than directed. A special MedGuide will be given to you by the pharmacist with each prescription and refill. Be sure to read this information carefully each time. Talk to your pediatrician regarding the use of this medicine in children. Special care may be needed. Overdosage: If you think you have taken too much of this medicine contact a poison control center or emergency room at once. NOTE: This medicine is only for you. Do not share this medicine with others. What if I miss a dose? If you miss a dose, take it as soon as you can. If it is almost time for your next dose, take only that dose. Do not take double or extra doses. What may interact with this medicine? -cholestyramine -female hormones, like estrogens -warfarin This list may not describe all possible interactions. Give your health care provider a list of all the medicines, herbs, non-prescription drugs, or dietary supplements you use. Also tell them if you smoke, drink alcohol, or use illegal drugs. Some items may interact with your medicine. What should I watch for while using this medicine? Visit your doctor or health care professional for regular checks on your progress. Do not stop taking this medicine except on the advice of your doctor or health care professional. If you are taking this medicine to reduce your risk of getting breast cancer, you should know that this medicine does not prevent all types of breast cancer. Talk to your doctor if you have questions. This medicine does not prevent hot flashes. It may cause hot flashes in some patients at the start of therapy. You should make sure that you get enough calcium and vitamin D while you are taking this medicine. Discuss the foods you eat and the vitamins you take with  your health care professional. Exercise may help to prevent bone loss. Discuss your exercise needs with your doctor or health care professional. This medicine can rarely cause blood clots. If you are going to have surgery, tell your doctor or health care professional that you are taking this medicine. This medicine should be stopped at least 3 days before surgery. After surgery, it should be restarted only after you are walking again. It should not be restarted while you still need long periods of bed rest. You should not smoke while taking this medicine. Smoking may increase your risk of blood clots or stroke. If you have any reason to think you are pregnant; stop taking this medicine at once and contact your doctor or health care professional. Do not breast feed while taking this medicine. What side effects may I notice from receiving this medicine? Side effects that you should report to your doctor or health care professional as soon as possible: -allergic reactions like skin rash, itching or hives, swelling of the face, lips, or tongue) -breast tissue changes or discharge -signs and symptoms of a blood clot such as breathing problems; changes in vision; chest pain; severe, sudden headache; pain, swelling, warmth in the leg; trouble speaking; sudden numbness or weakness  of the face, arm or leg -signs and symptoms of a stroke like changes in vision; confusion; trouble speaking or understanding; severe headaches; sudden numbness or weakness of the face, arm or leg; trouble walking; dizziness; loss of balance or coordination -vaginal discharge that is bloody, brown, or rust Side effects that usually do not require medical attention (report to your doctor or health care professional if they continue or are bothersome): -hot flashes -joint pain -leg cramps -sweating -swelling of the ankles, feet, hands This list may not describe all possible side effects. Call your doctor for medical advice about  side effects. You may report side effects to FDA at 1-800-FDA-1088. Where should I keep my medicine? Keep out of the reach of children. Store at room temperature between 15 and 30 degrees C (59 and 86 degrees F). Throw away any unused medicine after the expiration date. NOTE: This sheet is a summary. It may not cover all possible information. If you have questions about this medicine, talk to your doctor, pharmacist, or health care provider.  2018 Elsevier/Gold Standard (2016-11-19 17:15:34)   Denosumab injection What is this medicine? DENOSUMAB (den oh sue mab) slows bone breakdown. Prolia is used to treat osteoporosis in women after menopause and in men. Delton See is used to treat a high calcium level due to cancer and to prevent bone fractures and other bone problems caused by multiple myeloma or cancer bone metastases. Delton See is also used to treat giant cell tumor of the bone. This medicine may be used for other purposes; ask your health care provider or pharmacist if you have questions. COMMON BRAND NAME(S): Prolia, XGEVA What should I tell my health care provider before I take this medicine? They need to know if you have any of these conditions: -dental disease -having surgery or tooth extraction -infection -kidney disease -low levels of calcium or Vitamin D in the blood -malnutrition -on hemodialysis -skin conditions or sensitivity -thyroid or parathyroid disease -an unusual reaction to denosumab, other medicines, foods, dyes, or preservatives -pregnant or trying to get pregnant -breast-feeding How should I use this medicine? This medicine is for injection under the skin. It is given by a health care professional in a hospital or clinic setting. If you are getting Prolia, a special MedGuide will be given to you by the pharmacist with each prescription and refill. Be sure to read this information carefully each time. For Prolia, talk to your pediatrician regarding the use of this  medicine in children. Special care may be needed. For Delton See, talk to your pediatrician regarding the use of this medicine in children. While this drug may be prescribed for children as young as 13 years for selected conditions, precautions do apply. Overdosage: If you think you have taken too much of this medicine contact a poison control center or emergency room at once. NOTE: This medicine is only for you. Do not share this medicine with others. What if I miss a dose? It is important not to miss your dose. Call your doctor or health care professional if you are unable to keep an appointment. What may interact with this medicine? Do not take this medicine with any of the following medications: -other medicines containing denosumab This medicine may also interact with the following medications: -medicines that lower your chance of fighting infection -steroid medicines like prednisone or cortisone This list may not describe all possible interactions. Give your health care provider a list of all the medicines, herbs, non-prescription drugs, or dietary supplements you use. Also  tell them if you smoke, drink alcohol, or use illegal drugs. Some items may interact with your medicine. What should I watch for while using this medicine? Visit your doctor or health care professional for regular checks on your progress. Your doctor or health care professional may order blood tests and other tests to see how you are doing. Call your doctor or health care professional for advice if you get a fever, chills or sore throat, or other symptoms of a cold or flu. Do not treat yourself. This drug may decrease your body's ability to fight infection. Try to avoid being around people who are sick. You should make sure you get enough calcium and vitamin D while you are taking this medicine, unless your doctor tells you not to. Discuss the foods you eat and the vitamins you take with your health care professional. See your  dentist regularly. Brush and floss your teeth as directed. Before you have any dental work done, tell your dentist you are receiving this medicine. Do not become pregnant while taking this medicine or for 5 months after stopping it. Talk with your doctor or health care professional about your birth control options while taking this medicine. Women should inform their doctor if they wish to become pregnant or think they might be pregnant. There is a potential for serious side effects to an unborn child. Talk to your health care professional or pharmacist for more information. What side effects may I notice from receiving this medicine? Side effects that you should report to your doctor or health care professional as soon as possible: -allergic reactions like skin rash, itching or hives, swelling of the face, lips, or tongue -bone pain -breathing problems -dizziness -jaw pain, especially after dental work -redness, blistering, peeling of the skin -signs and symptoms of infection like fever or chills; cough; sore throat; pain or trouble passing urine -signs of low calcium like fast heartbeat, muscle cramps or muscle pain; pain, tingling, numbness in the hands or feet; seizures -unusual bleeding or bruising -unusually weak or tired Side effects that usually do not require medical attention (report to your doctor or health care professional if they continue or are bothersome): -constipation -diarrhea -headache -joint pain -loss of appetite -muscle pain -runny nose -tiredness -upset stomach This list may not describe all possible side effects. Call your doctor for medical advice about side effects. You may report side effects to FDA at 1-800-FDA-1088. Where should I keep my medicine? This medicine is only given in a clinic, doctor's office, or other health care setting and will not be stored at home. NOTE: This sheet is a summary. It may not cover all possible information. If you have questions  about this medicine, talk to your doctor, pharmacist, or health care provider.  2018 Elsevier/Gold Standard (2016-11-04 19:17:21)

## 2017-03-05 ENCOUNTER — Encounter: Payer: Self-pay | Admitting: Family Medicine

## 2017-03-10 DIAGNOSIS — M810 Age-related osteoporosis without current pathological fracture: Secondary | ICD-10-CM | POA: Diagnosis not present

## 2017-03-10 DIAGNOSIS — K219 Gastro-esophageal reflux disease without esophagitis: Secondary | ICD-10-CM | POA: Diagnosis not present

## 2017-03-17 DIAGNOSIS — M810 Age-related osteoporosis without current pathological fracture: Secondary | ICD-10-CM | POA: Diagnosis not present

## 2017-03-26 ENCOUNTER — Encounter: Payer: Self-pay | Admitting: Obstetrics and Gynecology

## 2017-03-28 ENCOUNTER — Other Ambulatory Visit: Payer: Self-pay | Admitting: Obstetrics and Gynecology

## 2017-03-28 MED ORDER — RISEDRONATE SODIUM 35 MG PO TBEC: 35.0000 mg | DELAYED_RELEASE_TABLET | ORAL | 12 refills | Status: DC

## 2017-06-03 DIAGNOSIS — Z1231 Encounter for screening mammogram for malignant neoplasm of breast: Secondary | ICD-10-CM | POA: Diagnosis not present

## 2017-06-04 ENCOUNTER — Encounter: Payer: Self-pay | Admitting: Family Medicine

## 2017-06-05 ENCOUNTER — Encounter: Payer: Self-pay | Admitting: *Deleted

## 2017-08-18 ENCOUNTER — Ambulatory Visit: Payer: 59

## 2017-08-26 ENCOUNTER — Ambulatory Visit: Payer: 59

## 2017-09-01 ENCOUNTER — Ambulatory Visit (INDEPENDENT_AMBULATORY_CARE_PROVIDER_SITE_OTHER): Payer: 59

## 2017-09-01 DIAGNOSIS — Z23 Encounter for immunization: Secondary | ICD-10-CM | POA: Diagnosis not present

## 2017-09-04 DIAGNOSIS — L281 Prurigo nodularis: Secondary | ICD-10-CM | POA: Diagnosis not present

## 2017-11-27 DIAGNOSIS — L821 Other seborrheic keratosis: Secondary | ICD-10-CM | POA: Diagnosis not present

## 2017-11-27 DIAGNOSIS — L814 Other melanin hyperpigmentation: Secondary | ICD-10-CM | POA: Diagnosis not present

## 2017-11-27 DIAGNOSIS — D1801 Hemangioma of skin and subcutaneous tissue: Secondary | ICD-10-CM | POA: Diagnosis not present

## 2017-12-07 ENCOUNTER — Other Ambulatory Visit: Payer: Self-pay | Admitting: Family Medicine

## 2017-12-07 DIAGNOSIS — M810 Age-related osteoporosis without current pathological fracture: Secondary | ICD-10-CM

## 2017-12-07 DIAGNOSIS — E785 Hyperlipidemia, unspecified: Secondary | ICD-10-CM

## 2017-12-11 ENCOUNTER — Other Ambulatory Visit (INDEPENDENT_AMBULATORY_CARE_PROVIDER_SITE_OTHER): Payer: 59

## 2017-12-11 DIAGNOSIS — M810 Age-related osteoporosis without current pathological fracture: Secondary | ICD-10-CM

## 2017-12-11 DIAGNOSIS — E785 Hyperlipidemia, unspecified: Secondary | ICD-10-CM

## 2017-12-11 LAB — LIPID PANEL
CHOLESTEROL: 211 mg/dL — AB (ref 0–200)
HDL: 78.2 mg/dL (ref >39.00)
LDL CALC: 122 mg/dL — AB (ref 0–99)
NonHDL: 133.2
TRIGLYCERIDES: 55 mg/dL (ref 0.0–149.0)
Total CHOL/HDL Ratio: 3
VLDL: 11 mg/dL (ref 0.0–40.0)

## 2017-12-11 LAB — VITAMIN D 25 HYDROXY (VIT D DEFICIENCY, FRACTURES): VITD: 50.12 ng/mL (ref 30.00–100.00)

## 2017-12-11 LAB — COMPREHENSIVE METABOLIC PANEL
ALBUMIN: 4.4 g/dL (ref 3.5–5.2)
ALK PHOS: 33 U/L — AB (ref 39–117)
ALT: 15 U/L (ref 0–35)
AST: 17 U/L (ref 0–37)
BILIRUBIN TOTAL: 0.6 mg/dL (ref 0.2–1.2)
BUN: 17 mg/dL (ref 6–23)
CALCIUM: 9.4 mg/dL (ref 8.4–10.5)
CO2: 29 mEq/L (ref 19–32)
Chloride: 104 mEq/L (ref 96–112)
Creatinine, Ser: 0.9 mg/dL (ref 0.40–1.20)
GFR: 67.53 mL/min (ref >60.00)
Glucose, Bld: 93 mg/dL (ref 70–99)
Potassium: 4.3 mEq/L (ref 3.5–5.1)
SODIUM: 140 meq/L (ref 135–145)
TOTAL PROTEIN: 7.1 g/dL (ref 6.0–8.3)

## 2017-12-18 ENCOUNTER — Encounter: Payer: Self-pay | Admitting: Family Medicine

## 2017-12-18 ENCOUNTER — Ambulatory Visit (INDEPENDENT_AMBULATORY_CARE_PROVIDER_SITE_OTHER): Payer: 59 | Admitting: Family Medicine

## 2017-12-18 VITALS — BP 126/70 | HR 73 | Temp 97.8°F | Ht 67.0 in | Wt 118.8 lb

## 2017-12-18 DIAGNOSIS — Z0000 Encounter for general adult medical examination without abnormal findings: Secondary | ICD-10-CM

## 2017-12-18 DIAGNOSIS — M810 Age-related osteoporosis without current pathological fracture: Secondary | ICD-10-CM

## 2017-12-18 DIAGNOSIS — E785 Hyperlipidemia, unspecified: Secondary | ICD-10-CM

## 2017-12-18 MED ORDER — ZOSTER VAC RECOMB ADJUVANTED 50 MCG/0.5ML IM SUSR: 0.5000 mL | Freq: Once | INTRAMUSCULAR | 1 refills | Status: AC
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
## 2017-12-18 NOTE — Patient Instructions (Signed)
Update me as needed.   Keep exercising and don't change your meds for now.  Take care.  Glad to see you.

## 2017-12-20 DIAGNOSIS — E785 Hyperlipidemia, unspecified: Secondary | ICD-10-CM | POA: Insufficient documentation

## 2017-12-20 NOTE — Assessment & Plan Note (Signed)
High HLD.  No FH early CAD.  7% ASCVD risk, with 6.3% with statin/etc, which is a marginal gain, d/w pt- this doesn't include her likely neg FH (her father didn't have early CAD and didn't have her healthy lifestyle).  Her current ASCVD score may over-estimate her risk.  Would continue as is with diet and exercise.  She agrees.

## 2017-12-20 NOTE — Assessment & Plan Note (Signed)
Risedronate per outside MD at [facility].  D/w pt.  She has DXA per outside clinic.  She did better with delayed release form, able to tolerate.
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
## 2018-02-03 ENCOUNTER — Ambulatory Visit: Payer: 59 | Admitting: Obstetrics and Gynecology

## 2018-02-03 ENCOUNTER — Other Ambulatory Visit: Payer: Self-pay

## 2018-02-03 ENCOUNTER — Encounter: Payer: Self-pay | Admitting: Obstetrics and Gynecology

## 2018-02-03 VITALS — BP 116/60 | HR 76 | Resp 14 | Ht 66.0 in | Wt 118.8 lb

## 2018-02-03 DIAGNOSIS — N859 Noninflammatory disorder of uterus, unspecified: Secondary | ICD-10-CM | POA: Diagnosis not present

## 2018-02-03 DIAGNOSIS — N858 Other specified noninflammatory disorders of uterus: Secondary | ICD-10-CM

## 2018-02-03 DIAGNOSIS — Z01419 Encounter for gynecological examination (general) (routine) without abnormal findings: Secondary | ICD-10-CM

## 2018-02-03 LAB — CBC
HEMATOCRIT: 39.2 % (ref 34.0–46.6)
HEMOGLOBIN: 12.7 g/dL (ref 11.1–15.9)
MCH: 29.6 pg (ref 26.6–33.0)
MCHC: 32.4 g/dL (ref 31.5–35.7)
MCV: 91 fL (ref 79–97)
Platelets: 259 10*3/uL (ref 150–379)
RBC: 4.29 x10E6/uL (ref 3.77–5.28)
RDW: 14 % (ref 12.3–15.4)
WBC: 6.4 10*3/uL (ref 3.4–10.8)
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
## 2018-02-03 NOTE — Patient Instructions (Signed)

## 2018-02-04 ENCOUNTER — Telehealth: Payer: Self-pay | Admitting: Obstetrics and Gynecology

## 2018-02-04 NOTE — Telephone Encounter (Signed)
Spoke with patient regarding benefit for recommended ultrasound. Patient understood and agreeable. Patient ready to schedule. Patient scheduled 02/18/18 with Dr Quincy Simmonds. Patient aware of appointment date, arrival time and cancellation policy. No further questions. Ok to close   cc: Dr Quincy Simmonds

## 2018-02-04 NOTE — Telephone Encounter (Signed)
Call placed to patient to review benefits and scheduled recommended ultrasound. Left voicemail message requesting a return call °

## 2018-02-04 NOTE — Telephone Encounter (Signed)
Patient returned call to Suzy. °

## 2018-02-18 ENCOUNTER — Ambulatory Visit: Payer: 59 | Admitting: Obstetrics and Gynecology

## 2018-02-18 ENCOUNTER — Encounter: Payer: Self-pay | Admitting: Obstetrics and Gynecology

## 2018-02-18 ENCOUNTER — Other Ambulatory Visit: Payer: Self-pay

## 2018-02-18 ENCOUNTER — Other Ambulatory Visit: Payer: 59

## 2018-02-18 ENCOUNTER — Ambulatory Visit (INDEPENDENT_AMBULATORY_CARE_PROVIDER_SITE_OTHER): Payer: 59

## 2018-02-18 VITALS — BP 102/60 | HR 80 | Resp 16 | Ht 66.0 in | Wt 118.0 lb

## 2018-02-18 DIAGNOSIS — D219 Benign neoplasm of connective and other soft tissue, unspecified: Secondary | ICD-10-CM

## 2018-02-18 DIAGNOSIS — N859 Noninflammatory disorder of uterus, unspecified: Secondary | ICD-10-CM | POA: Diagnosis not present

## 2018-02-18 DIAGNOSIS — N858 Other specified noninflammatory disorders of uterus: Secondary | ICD-10-CM

## 2018-02-18 NOTE — Progress Notes (Signed)
Encounter reviewed by Dr. Brook Amundson C. Silva.  

## 2018-02-18 NOTE — Progress Notes (Signed)
GYNECOLOGY  VISIT   HPI: 62 y.o.   Married  Caucasian  female   G2P1 with Patient's last menstrual period was 05/27/2009.   here for ultrasound.  Had uterine enlargement on recent pelvic exam.  Fibroid was suspected.   No know history of fibroids.   No vaginal bleeding.   Occasional right sided discomfort since hernia surgery.   GYNECOLOGIC HISTORY: Patient's last menstrual period was 05/27/2009. Contraception:  Postmenopausal Menopausal hormone therapy:  none Last mammogram:  06/03/17 BIRADS 1 negative/density c Last pap smear:   01/21/17 Pap and HR HPV negative        OB History    Gravida  2   Para  1   Term      Preterm      AB      Living  1     SAB      TAB      Ectopic      Multiple      Live Births                 Patient Active Problem List   Diagnosis Date Noted  . HLD (hyperlipidemia) 12/20/2017  . Routine general medical examination at a health care facility 12/17/2016  . Advance care planning 12/17/2016  . Osteoporosis 09/16/2010    Past Medical History:  Diagnosis Date  . Anemia   . ASCUS on Pap smear 2006   Asc-us pap w/HRHPV  . Breast cyst 1998 1999   2005 x3  . Endometriosis   . Family history of alcoholism   . Family history of diabetes mellitus    1st degree relative  . Femoral hernia    right  . Infertility, female   . Osteoporosis 2016   started actonel 2016    Past Surgical History:  Procedure Laterality Date  . Abdominal endometriosis  1991   removal right endometrioma  . Breast biopsy and benign lump removal Left 1992   Breast Bx atypia (L)  . BUNIONECTOMY  2011 and 2012  . COLONOSCOPY    . INGUINAL HERNIA REPAIR  05/2014   Dr. Barkley Bruns  . LAPAROSCOPY  1990   endometriosis  . TONSILLECTOMY      Current Outpatient Medications  Medication Sig Dispense Refill  . Calcium Carb-Cholecalciferol (CALTRATE 600+D) 600-800 MG-UNIT TABS Take by mouth 2 (two) times daily.    . cholecalciferol (VITAMIN D) 1000  units tablet Take 1,000 Units by mouth 2 (two) times daily.    . Multiple Vitamin (MULTIVITAMIN) tablet Take 1 tablet by mouth daily.    . Risedronate Sodium 35 MG TBEC Take 1 tablet (35 mg total) by mouth once a week. Take one tablet orally at end of meal.  DO NOT TAKE ON EMPTY STOMACH.Marland Kitchen 4 tablet 12   Current Facility-Administered Medications  Medication Dose Route Frequency Provider Last Rate Last Dose  . 0.9 %  sodium chloride infusion  500 mL Intravenous Continuous Pyrtle, Lajuan Lines, MD         ALLERGIES: Actonel [risedronate sodium]  Family History  Problem Relation Age of Onset  . Arthritis Mother   . Hyperlipidemia Mother   . Hypertension Mother   . Parkinsonism Mother   . Heart disease Mother   . Arthritis Father   . Hyperlipidemia Father   . Hypertension Father   . Heart disease Father        CAD (6 CABG)  . Stroke Father   . Diabetes Sister   . Alcohol  abuse Brother        dec 58 09/2015 drugs & alcohol  . Arthritis Other   . Hypertension Other   . Heart disease Other   . Colon cancer Neg Hx   . Breast cancer Neg Hx     Social History   Socioeconomic History  . Marital status: Married    Spouse name: Not on file  . Number of children: 1  . Years of education: Not on file  . Highest education level: Not on file  Occupational History  . Occupation: English as a second language teacher: [employer]  . Financial resource strain: Not on file  . Food insecurity:    Worry: Not on file    Inability: Not on file  . Transportation needs:    Medical: Not on file    Non-medical: Not on file  Tobacco Use  . Smoking status: Never Smoker  . Smokeless tobacco: Never Used  Substance and Sexual Activity  . Alcohol use: Yes    Alcohol/week: 0.0 oz    Comment: very rare wine  . Drug use: No  . Sexual activity: Never    Partners: Male    Birth control/protection: Post-menopausal  Lifestyle  . Physical activity:    Days per week: Not on file    Minutes per  session: Not on file  . Stress: Not on file  Relationships  . Social connections:    Talks on phone: Not on file    Gets together: Not on file    Attends religious service: Not on file    Active member of club or organization: Not on file    Attends meetings of clubs or organizations: Not on file    Relationship status: Not on file  . Intimate partner violence:    Fear of current or ex partner: Not on file    Emotionally abused: Not on file    Physically abused: Not on file    Forced sexual activity: Not on file  Other Topics Concern  . 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      ROS:  Pertinent items are noted in HPI.  PHYSICAL EXAMINATION:    BP 102/60 (BP Location: Right Arm, Patient Position: Sitting, Cuff Size: Normal)   Pulse 80   Resp 16   Ht 5\' 6"  (1.676 m)   Wt 118 lb (53.5 kg)   LMP 05/27/2009   BMI 19.05 kg/m     General appearance: alert, cooperative and appears stated age   Pelvic US 3 cm right subserosal fibroid.  Small sliver of fluid inside the endometrium.  EMS 2.32 mm.  Ovaries normal.  No free fluid.   ASSESSMENT  Subserosal fibroid.   PLAN  We discussed fibroids - etiology, signs and symptoms, rare risk of sarcoma, surgical care with hysterectomy by GYN ONC if concern for potential malignancy.  ACOG HO on fibroids.  MRI not necessary at this point.  Call for any vaginal bleeding.  FU in 3 months for repeat ultrasound.    An After Visit Summary was printed and given to the patient.  __25___ minutes face to face time of  which over 50% was spent in counseling.

## 2018-02-18 NOTE — Assessment & Plan Note (Signed)
3 cm on 02/18/18.  Plan for FU [place] in July 2019.

## 2018-03-08 ENCOUNTER — Telehealth: Payer: Self-pay | Admitting: Family Medicine

## 2018-03-08 NOTE — Telephone Encounter (Signed)
I don't know about power share.  Please send the request records. Thanks.

## 2018-03-08 NOTE — Telephone Encounter (Signed)
Junious Silk at [place] that we do not have power share. Junious Silk that she will need to get the dexa scans from the doctor that ordered them per office protocol. Colletta Maryland stated that the reason she called here was because she was told that the test were done in this office. Colletta Maryland stated that she will call the office that ordered them to see about getting them.

## 2018-03-08 NOTE — Telephone Encounter (Signed)
Copied from [place] [id number]. Topic: Quick Communication - See Telephone Encounter >> Mar 08, 2018 10:45 AM Ether Griffins B wrote: CRM for notification. See Telephone encounter for: 03/08/18.  Colletta Maryland with [facility] is calling requesting the imaging from dexa scans on 02/13/17 and 05/23/15. They have the reports but not imaging and the pt has an appt tomorrow. CB# [phone]. Colletta Maryland also wanted to know if the office uses power share.

## 2018-03-11 ENCOUNTER — Telehealth: Payer: Self-pay | Admitting: Obstetrics and Gynecology

## 2018-03-11 DIAGNOSIS — M810 Age-related osteoporosis without current pathological fracture: Secondary | ICD-10-CM | POA: Diagnosis not present

## 2018-03-11 NOTE — Telephone Encounter (Signed)
Patient was last seen 02/18/18 and was told someone would be contacting her to schedule the follow up PUS. Patient is ready to schedule this PUS.

## 2018-03-11 NOTE — Telephone Encounter (Signed)
Spoke with patient. PUS scheduled for 05/13/18 at 9:30am with consult to follow at 10am with Dr. Quincy Simmonds. Patient aware will be called with benefits prior to appointment. Patient verbalizes understanding.  Order previously placed for PUS.   Routing to provider for final review. Patient is agreeable to disposition. Will close encounter.   Cc: Magdalene Patricia, [address] [employer]

## 2018-03-25 DIAGNOSIS — M8588 Other specified disorders of bone density and structure, other site: Secondary | ICD-10-CM | POA: Diagnosis not present

## 2018-03-25 DIAGNOSIS — M810 Age-related osteoporosis without current pathological fracture: Secondary | ICD-10-CM | POA: Diagnosis not present

## 2018-03-25 LAB — HM DEXA SCAN

## 2018-04-01 ENCOUNTER — Telehealth: Payer: Self-pay | Admitting: Obstetrics and Gynecology

## 2018-04-01 NOTE — Telephone Encounter (Signed)
Please contact patient regarding BMD result.  She has osteoporosis of her right hip.   She has osteopenia of her left hip and spine.  There is no statistically significant change in BMD since her last exam in 2018.  She is currently taking Actonel through her specialist at [facility].   She may want a copy of her BMD sent to her to discuss with her prescriber.

## 2018-04-01 NOTE — Telephone Encounter (Signed)
Spoke with patient, advised as seen below per Dr. Quincy Simmonds. Patient request copy of BMD results be mailed, address on file confirmed. Patient verbalizes understanding and is agreeable.  Copy of results placed in mail, original to scan.   Encounter closed.

## 2018-04-03 ENCOUNTER — Other Ambulatory Visit: Payer: Self-pay | Admitting: Obstetrics and Gynecology

## 2018-04-05 NOTE — Telephone Encounter (Signed)
Medication refill request: risedronate sodium  Last AEX:  02/03/18 BS Next AEX: 02/25/19  Last MMG (if hormonal medication request): 06/03/17 BIRADS 1 negative  Refill authorized: 03/28/17 #4, 12 RF. Today, please advise.

## 2018-04-06 ENCOUNTER — Encounter: Payer: Self-pay | Admitting: Family Medicine

## 2018-05-13 ENCOUNTER — Encounter: Payer: Self-pay | Admitting: Obstetrics and Gynecology

## 2018-05-13 ENCOUNTER — Ambulatory Visit (INDEPENDENT_AMBULATORY_CARE_PROVIDER_SITE_OTHER): Payer: 59

## 2018-05-13 ENCOUNTER — Ambulatory Visit: Payer: 59 | Admitting: Obstetrics and Gynecology

## 2018-05-13 ENCOUNTER — Other Ambulatory Visit: Payer: Self-pay

## 2018-05-13 VITALS — BP 120/74 | HR 86 | Resp 14 | Ht 66.75 in | Wt 120.4 lb

## 2018-05-13 DIAGNOSIS — D219 Benign neoplasm of connective and other soft tissue, unspecified: Secondary | ICD-10-CM

## 2018-05-13 NOTE — Progress Notes (Signed)
GYNECOLOGY  VISIT   HPI: 62 y.o.   Married  Caucasian  female   G2P1 with Patient's last menstrual period was 05/27/2009.   here for   Pelvic ultrasound FU of new dx of uterine fibroid noted on pelvic exam.   Prior US in April in office measured fibroid at 29 mm subserosal fundal.  Denies bleeding and pain.   Mother had a stroke and is recovering.  Planning for placement in a new living situation.   GYNECOLOGIC HISTORY: Patient's last menstrual period was 05/27/2009. Contraception:  Post menopausal  Menopausal hormone therapy:  none Last mammogram:  06-03-17 density C/BIRADS 1 negative  Last pap smear:   01-21-17 negative, HR HPV negative         OB History    Gravida  2   Para  1   Term      Preterm      AB      Living  1     SAB      TAB      Ectopic      Multiple      Live Births                 Patient Active Problem List   Diagnosis Date Noted  . Fibroid 02/18/2018  . HLD (hyperlipidemia) 12/20/2017  . Routine general medical examination at a health care facility 12/17/2016  . Advance care planning 12/17/2016  . Osteoporosis 09/16/2010    Past Medical History:  Diagnosis Date  . Anemia   . ASCUS on Pap smear 2006   Asc-us pap w/HRHPV  . Breast cyst 1998 1999   2005 x3  . Endometriosis   . Family history of alcoholism   . Family history of diabetes mellitus    1st degree relative  . Femoral hernia    right  . Infertility, female   . Osteoporosis 2016   started actonel 2016    Past Surgical History:  Procedure Laterality Date  . Abdominal endometriosis  1991   removal right endometrioma  . Breast biopsy and benign lump removal Left 1992   Breast Bx atypia (L)  . BUNIONECTOMY  2011 and 2012  . COLONOSCOPY    . INGUINAL HERNIA REPAIR  05/2014   Dr. Barkley Bruns  . LAPAROSCOPY  1990   endometriosis  . TONSILLECTOMY      Current Outpatient Medications  Medication Sig Dispense Refill  . Calcium Carb-Cholecalciferol (CALTRATE 600+D)  600-800 MG-UNIT TABS Take by mouth 2 (two) times daily.    . cholecalciferol (VITAMIN D) 1000 units tablet Take 1,000 Units by mouth 2 (two) times daily.    . Multiple Vitamin (MULTIVITAMIN) tablet Take 1 tablet by mouth daily.    . Risedronate Sodium 35 MG TBEC TAKE 1 TABLET BY MOUTH WEEKLY AT THE END OF A MEAL. DO NOT TAKE ON AN EMPTY STOMACH 4 tablet 10   No current facility-administered medications for this visit.      ALLERGIES: Actonel [risedronate sodium]  Family History  Problem Relation Age of Onset  . Arthritis Mother   . Hyperlipidemia Mother   . Hypertension Mother   . Parkinsonism Mother   . Heart disease Mother   . Arthritis Father   . Hyperlipidemia Father   . Hypertension Father   . Heart disease Father        CAD (6 CABG)  . Stroke Father   . Diabetes Sister   . Alcohol abuse Brother  dec 58 09/2015 drugs & alcohol  . Arthritis Other   . Hypertension Other   . Heart disease Other   . Colon cancer Neg Hx   . Breast cancer Neg Hx     Social History   Socioeconomic History  . Marital status: Married    Spouse name: Not on file  . Number of children: 1  . Years of education: Not on file  . Highest education level: Not on file  Occupational History  . Occupation: English as a second language teacher: [employer]  . Financial resource strain: Not on file  . Food insecurity:    Worry: Not on file    Inability: Not on file  . Transportation needs:    Medical: Not on file    Non-medical: Not on file  Tobacco Use  . Smoking status: Never Smoker  . Smokeless tobacco: Never Used  Substance and Sexual Activity  . Alcohol use: Yes    Alcohol/week: 0.0 oz    Comment: very rare wine  . Drug use: No  . Sexual activity: Never    Partners: Male    Birth control/protection: Post-menopausal  Lifestyle  . Physical activity:    Days per week: Not on file    Minutes per session: Not on file  . Stress: Not on file  Relationships  . Social  connections:    Talks on phone: Not on file    Gets together: Not on file    Attends religious service: Not on file    Active member of club or organization: Not on file    Attends meetings of clubs or organizations: Not on file    Relationship status: Not on file  . Intimate partner violence:    Fear of current or ex partner: Not on file    Emotionally abused: Not on file    Physically abused: Not on file    Forced sexual activity: Not on file  Other Topics Concern  . 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      Review of Systems  Constitutional: Negative.   HENT: Negative.   Eyes: Negative.   Respiratory: Negative.   Cardiovascular: Negative.   Gastrointestinal: Negative.   Endocrine: Negative.   Genitourinary: Negative.   Musculoskeletal: Negative.   Skin: Negative.   Allergic/Immunologic: Negative.   Neurological: Negative.   Hematological: Negative.   Psychiatric/Behavioral: Negative.     PHYSICAL EXAMINATION:    BP 120/74 (BP Location: Right Arm, Patient Position: Sitting, Cuff Size: Normal)   Pulse 86   Resp 14   Ht 5' 6.75" (1.695 m)   Wt 120 lb 6.4 oz (54.6 kg)   LMP 05/27/2009   BMI 19.00 kg/m     General appearance: alert, cooperative and appears stated age   Pelvic US Uterus with 26 mm subserosal fibroid, which previously measured 29 mm. EMS 1.64 mm with sliver of fluid.  Ovaries normal.  No free fluid.  ASSESSMENT  Stable uterine fibroid, measuring just slightly smaller today.   PLAN  We discussed the benign nature of fibroids and the 1% or less chance of sarcoma.  The location of her  fibroid reviewed with a drawing I did today.  She has the ACOG  brochure for this as well. We can follow this temporarily with yearly ultrasounds.  Return for yearly exam and prn.    An After Visit Summary was printed and given to the patient.  __15____ minutes face to face time of which over 50% was spent in counseling.

## 2018-05-13 NOTE — Assessment & Plan Note (Signed)
Subserosal, 26 mm.  Do yearly pelvic ultrasound for stability.

## 2018-05-13 NOTE — Progress Notes (Signed)
Encounter reviewed by Dr. Brook Amundson C. Silva.  

## 2018-06-02 DIAGNOSIS — Z23 Encounter for immunization: Secondary | ICD-10-CM | POA: Diagnosis not present

## 2018-06-04 DIAGNOSIS — Z1231 Encounter for screening mammogram for malignant neoplasm of breast: Secondary | ICD-10-CM | POA: Diagnosis not present

## 2018-06-04 LAB — HM MAMMOGRAPHY

## 2018-06-10 ENCOUNTER — Encounter: Payer: Self-pay | Admitting: Family Medicine

## 2018-09-02 ENCOUNTER — Ambulatory Visit (INDEPENDENT_AMBULATORY_CARE_PROVIDER_SITE_OTHER): Payer: 59

## 2018-09-02 DIAGNOSIS — Z23 Encounter for immunization: Secondary | ICD-10-CM

## 2018-09-16 DIAGNOSIS — H15121 Nodular episcleritis, right eye: Secondary | ICD-10-CM | POA: Diagnosis not present

## 2018-09-24 DIAGNOSIS — Z23 Encounter for immunization: Secondary | ICD-10-CM | POA: Diagnosis not present

## 2018-09-27 DIAGNOSIS — H15001 Unspecified scleritis, right eye: Secondary | ICD-10-CM | POA: Diagnosis not present

## 2018-09-30 DIAGNOSIS — H04122 Dry eye syndrome of left lacrimal gland: Secondary | ICD-10-CM | POA: Diagnosis not present

## 2018-09-30 DIAGNOSIS — H15111 Episcleritis periodica fugax, right eye: Secondary | ICD-10-CM | POA: Diagnosis not present

## 2018-10-01 DIAGNOSIS — Z7689 Persons encountering health services in other specified circumstances: Secondary | ICD-10-CM | POA: Diagnosis not present

## 2018-10-01 DIAGNOSIS — H15111 Episcleritis periodica fugax, right eye: Secondary | ICD-10-CM | POA: Diagnosis not present

## 2018-11-11 DIAGNOSIS — H15111 Episcleritis periodica fugax, right eye: Secondary | ICD-10-CM | POA: Diagnosis not present

## 2018-12-20 ENCOUNTER — Other Ambulatory Visit: Payer: Self-pay | Admitting: Family Medicine

## 2018-12-20 DIAGNOSIS — M810 Age-related osteoporosis without current pathological fracture: Secondary | ICD-10-CM

## 2018-12-20 DIAGNOSIS — E785 Hyperlipidemia, unspecified: Secondary | ICD-10-CM

## 2018-12-22 ENCOUNTER — Other Ambulatory Visit (INDEPENDENT_AMBULATORY_CARE_PROVIDER_SITE_OTHER): Payer: 59

## 2018-12-22 DIAGNOSIS — M810 Age-related osteoporosis without current pathological fracture: Secondary | ICD-10-CM | POA: Diagnosis not present

## 2018-12-22 DIAGNOSIS — E785 Hyperlipidemia, unspecified: Secondary | ICD-10-CM | POA: Diagnosis not present

## 2018-12-22 LAB — COMPREHENSIVE METABOLIC PANEL
ALT: 13 U/L (ref 0–35)
AST: 15 U/L (ref 0–37)
Albumin: 4.3 g/dL (ref 3.5–5.2)
Alkaline Phosphatase: 34 U/L — ABNORMAL LOW (ref 39–117)
BUN: 16 mg/dL (ref 6–23)
CO2: 28 meq/L (ref 19–32)
Calcium: 9.6 mg/dL (ref 8.4–10.5)
Chloride: 104 mEq/L (ref 96–112)
Creatinine, Ser: 0.92 mg/dL (ref 0.40–1.20)
GFR: 61.74 mL/min (ref >60.00)
GLUCOSE: 89 mg/dL (ref 70–99)
POTASSIUM: 4 meq/L (ref 3.5–5.1)
Sodium: 140 mEq/L (ref 135–145)
Total Bilirubin: 0.6 mg/dL (ref 0.2–1.2)
Total Protein: 6.7 g/dL (ref 6.0–8.3)

## 2018-12-22 LAB — LIPID PANEL
CHOL/HDL RATIO: 2
Cholesterol: 198 mg/dL (ref 0–200)
HDL: 80.6 mg/dL (ref >39.00)
LDL Cholesterol: 107 mg/dL — ABNORMAL HIGH (ref 0–99)
NONHDL: 117.65
Triglycerides: 55 mg/dL (ref 0.0–149.0)
VLDL: 11 mg/dL (ref 0.0–40.0)

## 2018-12-23 LAB — VITAMIN D 25 HYDROXY (VIT D DEFICIENCY, FRACTURES): VITD: 46.05 ng/mL (ref 30.00–100.00)

## 2018-12-27 ENCOUNTER — Encounter: Payer: Self-pay | Admitting: Family Medicine

## 2018-12-27 ENCOUNTER — Ambulatory Visit (INDEPENDENT_AMBULATORY_CARE_PROVIDER_SITE_OTHER): Payer: 59 | Admitting: Family Medicine

## 2018-12-27 VITALS — BP 102/58 | HR 82 | Temp 98.3°F | Ht 66.75 in | Wt 120.2 lb

## 2018-12-27 DIAGNOSIS — Z7189 Other specified counseling: Secondary | ICD-10-CM

## 2018-12-27 DIAGNOSIS — H15009 Unspecified scleritis, unspecified eye: Secondary | ICD-10-CM

## 2018-12-27 DIAGNOSIS — Z0000 Encounter for general adult medical examination without abnormal findings: Secondary | ICD-10-CM | POA: Diagnosis not present

## 2018-12-27 DIAGNOSIS — E785 Hyperlipidemia, unspecified: Secondary | ICD-10-CM

## 2018-12-27 DIAGNOSIS — M810 Age-related osteoporosis without current pathological fracture: Secondary | ICD-10-CM

## 2018-12-27 NOTE — Patient Instructions (Signed)
Take care.  Glad to see you. Update me as needed.  Thanks for your effort.  

## 2018-12-27 NOTE — Progress Notes (Signed)
CPE- See plan.  Routine anticipatory guidance given to patient.  See health maintenance.  The possibility exists that previously documented standard health maintenance information may have been brought forward from a previous encounter into this note.  If needed, that same information has been updated to reflect the current situation based on today's encounter.    Tetanus 2014 Flu 2019 PNA not due yet, d/w pt.  Shingrix prev done.  Colonoscopy 2018  Living will d/w pt. Husband designated if patient were incapacitated.  Diet and exercise d/w pt. She is working on both, doing well with both.   Pap per gyn clinic.  Mammogram per gyn clinic.    Her husband was dx'd with prostate cancer and has f/u pending.    She had scleral blistering, saw eye clinic, was dx'd with anterior nondular scleritis.  She had f/u testing for autoimmune labs, unremarkable per patient report.  She is on steroid drop taper.  Requesting records.  Osteoporosis per [facility], has been on risedronate for 3.5 years. She is able to tolerate current med.  D/w pt.    She is caring for her mother with Parkinson's and R sided deficits from CVA, currently in [place].  She requires more help now.  He has more limitations from Parkinson's, wheelchair bound.  Patient is taking care of all of her shopping, appointments, etc.    PMH and SH reviewed  Meds, vitals, and allergies reviewed.   ROS: Per HPI.  Unless specifically indicated otherwise in HPI, the patient denies:  General: fever. Eyes: acute vision changes ENT: sore throat Cardiovascular: chest pain Respiratory: SOB GI: vomiting GU: dysuria Musculoskeletal: acute back pain Derm: acute rash Neuro: acute motor dysfunction Psych: worsening mood Endocrine: polydipsia Heme: bleeding Allergy: hayfever  GEN: nad, alert and oriented HEENT: mucous membranes moist NECK: supple w/o LA CV: rrr. PULM: ctab, no inc wob ABD: soft, +bs EXT: no edema SKIN: no acute  rash

## 2018-12-29 MED ORDER — DEXAMETHASONE 0.1 % OP SUSP: 1.0000 [drp] | Freq: Three times a day (TID) | OPHTHALMIC | Status: DC

## 2018-12-29 NOTE — Assessment & Plan Note (Signed)
Osteoporosis per [facility], has been on risedronate for 3.5 years. She is able to tolerate current med.  D/w pt.

## 2018-12-29 NOTE — Assessment & Plan Note (Signed)
Living will d/w pt.  Husband designated if patient were incapacitated.  

## 2018-12-29 NOTE — Assessment & Plan Note (Signed)
Symptoms currently controlled.  Requesting records.

## 2018-12-29 NOTE — Assessment & Plan Note (Signed)
With high HDL.  Continue work on diet and exercise.  She agrees.

## 2018-12-29 NOTE — Assessment & Plan Note (Signed)
Tetanus 2014 Flu 2019 PNA not due yet, d/w pt.  Shingrix prev done.  Colonoscopy 2018  Living will d/w pt. Husband designated if patient were incapacitated.  Diet and exercise d/w pt. She is working on both, doing well with both.   Pap per gyn clinic.  Mammogram per gyn clinic.

## 2019-01-20 DIAGNOSIS — H15111 Episcleritis periodica fugax, right eye: Secondary | ICD-10-CM | POA: Diagnosis not present

## 2019-02-10 ENCOUNTER — Telehealth: Payer: Self-pay | Admitting: Obstetrics and Gynecology

## 2019-02-10 NOTE — Telephone Encounter (Signed)
Spoke with patient. Advised of message as seen below from [place]. Patient would like to go ahead and schedule her PUS for July. Appointment scheduled for 05/12/2019 at 9 am with 9:30 am consult. Patient is agreeable to date and time. Patient is aware she will be contacted to reschedule aex when restrictions have been lifted from COVID 19 state of emergency.  Routing to provider and will close encounter.

## 2019-02-10 NOTE — Telephone Encounter (Signed)
Patient's pelvic ultrasound is due in July, 2020.  I can likely due her annual exam in June and then have her return in July for the ultrasound.  This will keep her relatively on schedule.

## 2019-02-10 NOTE — Telephone Encounter (Signed)
Spoke with patient. Patient states that her aex with Dr.Silva was cancelled due to COVID 19. States she is due to have a follow up ultrasound to follow a uterine fibroid that she has. Patient is concerned about this being postponed out any further. Advised will review with Dr.Silva and return call.  Dr.Silva, okay to schedule patient for PUS only in office for yearly evaluation of uterine fibroid?

## 2019-02-10 NOTE — Telephone Encounter (Signed)
Patient is concerned about cyst and would like to come in to have check.

## 2019-02-25 ENCOUNTER — Ambulatory Visit: Payer: 59 | Admitting: Obstetrics and Gynecology

## 2019-03-07 ENCOUNTER — Other Ambulatory Visit: Payer: Self-pay | Admitting: Obstetrics and Gynecology

## 2019-03-07 NOTE — Telephone Encounter (Signed)
Medication refill request: Actonel Last AEX:  02/03/18 BS Next AEX: 05/12/19 Last MMG (if hormonal medication request): 06/04/18 BIRADS 1 negative/density c Refill authorized: Please advise Order pended #12 w/0 refills if authorized

## 2019-03-07 NOTE — Telephone Encounter (Signed)
Patient calling to check status of refill request.

## 2019-03-15 ENCOUNTER — Other Ambulatory Visit: Payer: Self-pay

## 2019-03-15 ENCOUNTER — Encounter: Payer: Self-pay | Admitting: Family Medicine

## 2019-03-15 ENCOUNTER — Ambulatory Visit: Payer: 59 | Admitting: Family Medicine

## 2019-03-15 VITALS — BP 92/60 | HR 67 | Temp 97.7°F | Ht 66.75 in | Wt 122.2 lb

## 2019-03-15 DIAGNOSIS — S39012A Strain of muscle, fascia and tendon of lower back, initial encounter: Secondary | ICD-10-CM | POA: Diagnosis not present

## 2019-03-15 MED ORDER — MELOXICAM 7.5 MG PO TABS: 7.5000 mg | ORAL_TABLET | Freq: Every day | ORAL | 0 refills | Status: DC

## 2019-03-15 NOTE — Assessment & Plan Note (Signed)
Suspect muscle strain of back. Advised trying some yoga gentle ROM and stretching. Prescribed NSAIDs and PT referral. Return if not improving over 6 weeks.

## 2019-03-15 NOTE — Patient Instructions (Signed)
Low back strain - take meloxicam daily - make sure you keep moving - gentle stretching can be helpful

## 2019-03-15 NOTE — Progress Notes (Signed)
Subjective:     Sabrina Hansen is a 63 y.o. female presenting for Back Pain (about a week ago suddenly had stiffness in her whole back one day. Now pain located on the right lower side of the back and right hip. No injury. Has tried Advil and heat. 10 years ago had similar episode and used anti-inflammatory and did P.T. for it)     Back Pain  This is a new problem. The current episode started in the past 7 days. The pain is present in the gluteal and lumbar spine. The quality of the pain is described as stabbing (stiffness). The pain does not radiate. The pain is moderate. The pain is worse during the night. The symptoms are aggravated by standing (bending neck down). Pertinent negatives include no abdominal pain, bladder incontinence, bowel incontinence, chest pain, headaches, leg pain, numbness, tingling or weakness. Risk factors: hx of similar episode. She has tried NSAIDs for the symptoms. The treatment provided mild relief.   Pretty active at baseline Normally does an exercise class 3 days a week - but recently has been with sister and not attending exercise classes -- just walking currently  Review of Systems  Cardiovascular: Negative for chest pain.  Gastrointestinal: Negative for abdominal pain and bowel incontinence.  Genitourinary: Negative for bladder incontinence.  Musculoskeletal: Positive for back pain.  Neurological: Negative for tingling, weakness, numbness and headaches.     Social History   Tobacco Use  Smoking Status Never Smoker  Smokeless Tobacco Never Used        Objective:    BP Readings from Last 3 Encounters:  03/15/19 92/60  12/27/18 (!) 102/58  05/13/18 120/74   Wt Readings from Last 3 Encounters:  03/15/19 122 lb 4 oz (55.5 kg)  12/27/18 120 lb 3 oz (54.5 kg)  05/13/18 120 lb 6.4 oz (54.6 kg)    BP 92/60   Pulse 67   Temp 97.7 F (36.5 C)   Ht 5' 6.75" (1.695 m)   Wt 122 lb 4 oz (55.5 kg)   LMP 05/27/2009   SpO2 99%   BMI 19.29  kg/m    Physical Exam Constitutional:      General: She is not in acute distress.    Appearance: She is well-developed. She is not diaphoretic.  HENT:     Right Ear: External ear normal.     Left Ear: External ear normal.     Nose: Nose normal.  Eyes:     Conjunctiva/sclera: Conjunctivae normal.  Neck:     Musculoskeletal: Neck supple.  Cardiovascular:     Rate and Rhythm: Normal rate.  Pulmonary:     Effort: Pulmonary effort is normal.  Musculoskeletal:     Comments: Back:  Inspection: no lesions, no swelling Palpation: no spinal process or paraspinous ttp ROM: normal, but with discomfort with foreward and right flexion, and extension.  Strength: normal LE strength Neg straight leg raise  Skin:    General: Skin is warm and dry.     Capillary Refill: Capillary refill takes less than 2 seconds.  Neurological:     Mental Status: She is alert. Mental status is at baseline.  Psychiatric:        Mood and Affect: Mood normal.        Behavior: Behavior normal.           Assessment & Plan:   Problem List Items Addressed This Visit      Musculoskeletal and Integument   Strain of  back - Primary    Suspect muscle strain of back. Advised trying some yoga gentle ROM and stretching. Prescribed NSAIDs and PT referral. Return if not improving over 6 weeks.       Relevant Medications   meloxicam (MOBIC) 7.5 MG tablet   Other Relevant Orders   Ambulatory referral to Physical Therapy       Return in about 6 weeks (around 04/26/2019).  Lesleigh Noe, MD

## 2019-04-06 ENCOUNTER — Other Ambulatory Visit: Payer: Self-pay | Admitting: Family Medicine

## 2019-04-06 DIAGNOSIS — S39012A Strain of muscle, fascia and tendon of lower back, initial encounter: Secondary | ICD-10-CM

## 2019-04-06 NOTE — Telephone Encounter (Signed)
Spoke with patient. Patient saw Dr. Einar Pheasant in May for acute issue of back pain and was started on Meloxicam and referred for physical therapy. Refill request was sent automatically from pharmacy. She is still taking this medication daily but as of this week trying to take it only on the days of physical therapy because her back gets to hurt after exercises. She has about 1 week worth of tablets left. She is still doing physical therapy twice a week. This does help. Patient said if we wanted to send 1 more refill of this medication just in case that would be fine. Advised patient I would send this message to her PCP to decide. (patient is currently with her mom at the hospital, sitting a lot and that does not help either)

## 2019-04-06 NOTE — Telephone Encounter (Signed)
Sent, please update me as needed.  Thanks.

## 2019-04-29 ENCOUNTER — Other Ambulatory Visit: Payer: Self-pay | Admitting: Family Medicine

## 2019-04-29 DIAGNOSIS — S39012A Strain of muscle, fascia and tendon of lower back, initial encounter: Secondary | ICD-10-CM

## 2019-05-10 ENCOUNTER — Other Ambulatory Visit: Payer: Self-pay

## 2019-05-10 ENCOUNTER — Telehealth: Payer: Self-pay | Admitting: Obstetrics and Gynecology

## 2019-05-10 DIAGNOSIS — D219 Benign neoplasm of connective and other soft tissue, unspecified: Secondary | ICD-10-CM

## 2019-05-10 NOTE — Telephone Encounter (Signed)
Spoke with patient. Patient is scheduled for 1 yr f/u PUS for uterine fibroid on 05/12/19 with Dr. Quincy Simmonds. Patient has questions about coverage. Order placed for PUS. Advised will route to business office for return call. Patient agreeable.   Routing to [employer]  and [employer]

## 2019-05-10 NOTE — Telephone Encounter (Signed)
Left message to call Sharee Pimple, RN at [facility].    PUS scheduled for 05/12/19 with Dr. Quincy Simmonds

## 2019-05-10 NOTE — Telephone Encounter (Signed)
Patient is returning a call to [place].

## 2019-05-10 NOTE — Telephone Encounter (Signed)
Patient has questions about her ultrasound appointment.

## 2019-05-11 NOTE — Telephone Encounter (Signed)
Patient calling for benefits information for [place] 05/12/2019. Can be reached at [phone].

## 2019-05-11 NOTE — Telephone Encounter (Signed)
Returned call to patient. Reviewed benefit for scheduled ultrasound on 05/11/2019. Patient understood information presented, see account notes for details. Patient is scheduled with Dr Quincy Simmonds on 05/12/2019. Patient is aware of the appointment date and arrival time. See account notes. Will close encounter

## 2019-05-12 ENCOUNTER — Encounter: Payer: Self-pay | Admitting: Obstetrics and Gynecology

## 2019-05-12 ENCOUNTER — Ambulatory Visit: Payer: 59 | Admitting: Obstetrics and Gynecology

## 2019-05-12 ENCOUNTER — Ambulatory Visit (INDEPENDENT_AMBULATORY_CARE_PROVIDER_SITE_OTHER): Payer: 59

## 2019-05-12 ENCOUNTER — Other Ambulatory Visit: Payer: Self-pay

## 2019-05-12 VITALS — BP 110/60 | HR 70 | Temp 97.3°F | Resp 12 | Ht 66.75 in | Wt 121.6 lb

## 2019-05-12 DIAGNOSIS — D252 Subserosal leiomyoma of uterus: Secondary | ICD-10-CM

## 2019-05-12 DIAGNOSIS — D219 Benign neoplasm of connective and other soft tissue, unspecified: Secondary | ICD-10-CM

## 2019-05-12 NOTE — Progress Notes (Signed)
Encounter reviewed by Dr. Brook Amundson C. Silva.  

## 2019-05-12 NOTE — Progress Notes (Signed)
GYNECOLOGY  VISIT   HPI: 63 y.o.   Married  Caucasian  female   G2P1 with Patient's last menstrual period was 05/27/2009.   here for ultrasound for fibroid follow up.   Denies post menopausal bleeding.  No pelvic pain or discomfort.   Brother in law died suddenly of a PE.  He was a physician at [employer] [facility] [employer]. Mother ill with a PE.  Son getting married in December.  He is a Human resources officer.   GYNECOLOGIC HISTORY: Patient's last menstrual period was 05/27/2009. Contraception:  Postmenopausal Menopausal hormone therapy:  none Last mammogram:  06/04/18 BIRADS 1 negative/density c Last pap smear:   01-21-17 negative, HR HPV negative        OB History    Gravida  2   Para  1   Term      Preterm      AB      Living  1     SAB      TAB      Ectopic      Multiple      Live Births                 Patient Active Problem List   Diagnosis Date Noted  . Strain of back 03/15/2019  . Scleritis 12/27/2018  . Fibroid 02/18/2018  . HLD (hyperlipidemia) 12/20/2017  . Routine general medical examination at a health care facility 12/17/2016  . Advance care planning 12/17/2016  . Osteoporosis 09/16/2010    Past Medical History:  Diagnosis Date  . Anemia   . ASCUS on Pap smear 2006   Asc-us pap w/HRHPV  . Breast cyst 1998 1999   2005 x3  . Endometriosis   . Family history of alcoholism   . Family history of diabetes mellitus    1st degree relative  . Femoral hernia    right  . Infertility, female   . Osteoporosis 2016   started actonel 2016    Past Surgical History:  Procedure Laterality Date  . Abdominal endometriosis  1991   removal right endometrioma  . Breast biopsy and benign lump removal Left 1992   Breast Bx atypia (L)  . BUNIONECTOMY  2011 and 2012  . COLONOSCOPY    . INGUINAL HERNIA REPAIR  05/2014   Dr. Barkley Bruns  . LAPAROSCOPY  1990   endometriosis  . TONSILLECTOMY      Current Outpatient Medications  Medication Sig Dispense Refill  . Calcium  Carb-Cholecalciferol (CALTRATE 600+D) 600-800 MG-UNIT TABS Take by mouth 2 (two) times daily.    . cholecalciferol (VITAMIN D) 1000 units tablet Take 1,000 Units by mouth 2 (two) times daily.    Marland Kitchen dexamethasone (DECADRON) 0.1 % ophthalmic suspension Place 1 drop into the right eye every 8 (eight) hours. Per eye clinic. (Patient taking differently: Place 1 drop into the right eye every morning. Per eye clinic.)    . Multiple Vitamin (MULTIVITAMIN) tablet Take 1 tablet by mouth daily.    . Risedronate Sodium 35 MG TBEC TAKE 1 TABLET BY MOUTH WEEKLY AT THE END OF A MEAL. DO NOT TAKE ON AN EMPTY STOMACH 12 tablet 0   No current facility-administered medications for this visit.      ALLERGIES: Actonel [risedronate sodium]  Family History  Problem Relation Age of Onset  . Arthritis Mother   . Hyperlipidemia Mother   . Hypertension Mother   . Parkinsonism Mother   . Heart disease Mother   . Arthritis Father   .  Hyperlipidemia Father   . Hypertension Father   . Heart disease Father        CAD (6 CABG)  . Stroke Father   . Diabetes Sister   . Alcohol abuse Brother        dec 58 09/2015 drugs & alcohol  . Arthritis Other   . Hypertension Other   . Heart disease Other   . Colon cancer Neg Hx   . Breast cancer Neg Hx     Social History   Socioeconomic History  . Marital status: Married    Spouse name: Not on file  . Number of children: 1  . Years of education: Not on file  . Highest education level: Not on file  Occupational History  . Occupation: English as a second language teacher: [employer]  . Financial resource strain: Not on file  . Food insecurity    Worry: Not on file    Inability: Not on file  . Transportation needs    Medical: Not on file    Non-medical: Not on file  Tobacco Use  . Smoking status: Never Smoker  . Smokeless tobacco: Never Used  Substance and Sexual Activity  . Alcohol use: Yes    Alcohol/week: 0.0 standard drinks    Comment: very rare  wine  . Drug use: No  . Sexual activity: Not Currently    Partners: Male    Birth control/protection: Post-menopausal  Lifestyle  . Physical activity    Days per week: Not on file    Minutes per session: Not on file  . Stress: Not on file  Relationships  . Social Herbalist on phone: Not on file    Gets together: Not on file    Attends religious service: Not on file    Active member of club or organization: Not on file    Attends meetings of clubs or organizations: Not on file    Relationship status: Not on file  . Intimate partner violence    Fear of current or ex partner: Not on file    Emotionally abused: Not on file    Physically abused: Not on file    Forced sexual activity: Not on file  Other Topics Concern  . 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    Review of Systems  Constitutional: Negative.   HENT: Negative.   Eyes: Negative.   Respiratory: Negative.   Cardiovascular: Negative.   Gastrointestinal: Negative.   Endocrine: Negative.   Genitourinary: Negative.   Musculoskeletal: Negative.   Skin: Negative.   Allergic/Immunologic: Negative.   Neurological: Negative.   Hematological: Negative.   Psychiatric/Behavioral: Negative.     PHYSICAL EXAMINATION:    BP 110/60 (BP Location: Right Arm, Patient Position: Sitting, Cuff Size: Normal)   Pulse 70   Temp (!) 97.3 F (36.3 C) (Temporal)   Resp 12   Ht 5' 6.75" (1.695 m)   Wt 121 lb 9.6 oz (55.2 kg)   LMP 05/27/2009   BMI 19.19 kg/m     General appearance: alert, cooperative and appears stated age   Pelvic US Uterus with 28 mm subserosal fundal  fibroid.  Stable size.  EMS 2.26 mm with sliver of  fluid. Ovaries normal.  No free fluid.  ASSESSMENT  Stable fibroid. Fluid in endometrial canal with EMS under 3 mm.  No postmenopausal bleeding.  Bereavement.   PLAN  Reassurance regarding stable fibroid.  We discussed rare sarcoma formation in fibroids. No EMB needed.  Will do yearly pelvic US due to the new finding of this fibroid.  Annual exam given.  Support given for family loss and health challenges.  Return for well woman visit next week.    An After Visit Summary was printed and given to the patient.  __15____ minutes face to face time of which over 50% was spent in counseling.

## 2019-05-17 ENCOUNTER — Other Ambulatory Visit: Payer: Self-pay

## 2019-05-17 ENCOUNTER — Ambulatory Visit: Payer: 59 | Admitting: Obstetrics and Gynecology

## 2019-05-17 ENCOUNTER — Encounter: Payer: Self-pay | Admitting: Obstetrics and Gynecology

## 2019-05-17 VITALS — BP 110/60 | HR 80 | Temp 97.2°F | Resp 12 | Ht 66.5 in | Wt 122.0 lb

## 2019-05-17 DIAGNOSIS — Z01419 Encounter for gynecological examination (general) (routine) without abnormal findings: Secondary | ICD-10-CM

## 2019-05-17 MED ORDER — RISEDRONATE SODIUM 35 MG PO TBEC: DELAYED_RELEASE_TABLET | ORAL | 3 refills | Status: DC

## 2019-05-17 NOTE — Progress Notes (Signed)
63 y.o. G2P1 Married Caucasian female here for annual exam.    Not exercising as much.  Tolerating Actonel delayed release well.   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 Did a course of steroid drops.  Did extensive testing to rule out auto immune diseases.  Feeling better now.   Labs with PCP.   Son getting married in December.  He is a physician.   PCP: Elsie Stain, MD  Endocrinology:  Harriette Ohara, MD - [facility]   Patient's last menstrual period was 05/27/2009.           Sexually active: No.  The current method of family planning is post menopausal status.    Exercising: Yes.    yoga and walking Smoker:  no  Health Maintenance: Pap:  01-21-17 negative, HR HPV negative History of abnormal Pap:  Yes,06/2005 Acus/Pos HR HPV, colposcopy 07-25-05 showed atypia on cervical bx and ECC negative. No Treatment to cervix and paps normal since. MMG:  06/04/18 BIRADS 1 negative/density c.  This is scheduled for August at [facility].  Colonoscopy:  11-24-16 normal with Dr.Jay Pyrtle;next due 10/2026 BMD:  03/25/18  Result  Osteoporosis TDaP:  2014 HIV and Hep C: 12/09/16 Negative Screening Labs:  PCP Shingrix:  Completed in 2019.    reports that she has never smoked. She has never used smokeless tobacco. She reports current alcohol use. She reports that she does not use drugs.  Past Medical History:  Diagnosis Date  . Anemia   . ASCUS on Pap smear 2006   Asc-us pap w/HRHPV  . Breast cyst 1998 1999   2005 x3  . Endometriosis   . Family history of alcoholism   . Family history of diabetes mellitus    1st degree relative  . Femoral hernia    right  . Infertility, female   . Nodular scleritis of right eye   . Osteoporosis 2016   started actonel 2016    Past Surgical History:  Procedure Laterality Date  . Abdominal endometriosis  1991   removal right endometrioma  . Breast biopsy and benign lump removal Left 1992   Breast Bx atypia (L)  . BUNIONECTOMY  2011 and  2012  . COLONOSCOPY    . INGUINAL HERNIA REPAIR  05/2014   Dr. Barkley Bruns  . LAPAROSCOPY  1990   endometriosis  . TONSILLECTOMY      Current Outpatient Medications  Medication Sig Dispense Refill  . Calcium Carb-Cholecalciferol (CALTRATE 600+D) 600-800 MG-UNIT TABS Take by mouth 2 (two) times daily.    . cholecalciferol (VITAMIN D) 1000 units tablet Take 1,000 Units by mouth 2 (two) times daily.    Marland Kitchen dexamethasone (DECADRON) 0.1 % ophthalmic suspension Place 1 drop into the right eye every 8 (eight) hours. Per eye clinic. (Patient taking differently: Place 1 drop into the right eye every morning. Per eye clinic.)    . Multiple Vitamin (MULTIVITAMIN) tablet Take 1 tablet by mouth daily.    . Risedronate Sodium 35 MG TBEC TAKE 1 TABLET BY MOUTH WEEKLY AT THE END OF A MEAL. DO NOT TAKE ON AN EMPTY STOMACH 12 tablet 3   No current facility-administered medications for this visit.     Family History  Problem Relation Age of Onset  . Arthritis Mother   . Hyperlipidemia Mother   . Hypertension Mother   . Parkinsonism Mother   . Heart disease Mother   . Arthritis Father   . Hyperlipidemia Father   . Hypertension Father   .  Heart disease Father        CAD (6 CABG)  . Stroke Father   . Diabetes Sister   . Alcohol abuse Brother        dec 58 09/2015 drugs & alcohol  . Arthritis Other   . Hypertension Other   . Heart disease Other   . Colon cancer Neg Hx   . Breast cancer Neg Hx     Review of Systems  Constitutional: Negative.   HENT: Negative.   Eyes: Negative.   Respiratory: Negative.   Cardiovascular: Negative.   Gastrointestinal: Negative.   Endocrine: Negative.   Genitourinary: Negative.   Musculoskeletal: Negative.   Skin: Negative.   Allergic/Immunologic: Negative.   Neurological: Negative.   Hematological: Negative.   Psychiatric/Behavioral: Negative.     Exam:   BP 110/60 (BP Location: Right Arm, Patient Position: Sitting, Cuff Size: Normal)   Pulse 80    Temp (!) 97.2 F (36.2 C) (Temporal)   Resp 12   Ht 5' 6.5" (1.689 m)   Wt 122 lb (55.3 kg)   LMP 05/27/2009   BMI 19.40 kg/m     General appearance: alert, cooperative and appears stated age Head: normocephalic, without obvious abnormality, atraumatic Neck: no adenopathy, supple, symmetrical, trachea midline and thyroid normal to inspection and palpation Lungs: clear to auscultation bilaterally Breasts: normal appearance, no masses or tenderness, No nipple retraction or dimpling, No nipple discharge or bleeding, No axillary adenopathy Heart: regular rate and rhythm Abdomen: soft, non-tender; no masses, no organomegaly Extremities: extremities normal, atraumatic, no cyanosis or edema Skin: skin color, texture, turgor normal. No rashes or lesions Lymph nodes: cervical, supraclavicular, and axillary nodes normal. Neurologic: grossly normal  Pelvic: External genitalia:  no lesions              No abnormal inguinal nodes palpated.              Urethra:  normal appearing urethra with no masses, tenderness or lesions              Bartholins and Skenes: normal                 Vagina: normal appearing vagina with normal color and discharge, no lesions              Cervix: no lesions              Pap taken: No. Bimanual Exam:  Uterus:  Slightly enlarged, contour, position, consistency, mobility, non-tender              Adnexa: no mass, fullness, tenderness              Rectal exam: Yes.  .  Confirms.              Anus:  normal sphincter tone, no lesions  Chaperone was present for exam.  Assessment:   Well woman visit with normal exam. Remote hx of abnormal pap.  Osteoporosis.  Stable uterine fibroid.  Plan: Mammogram screening discussed. Self breast awareness reviewed. Pap and HR HPV as above. Guidelines for Calcium, Vitamin D, regular exercise program including cardiovascular and weight bearing exercise. Continue Actonel TBEC.  Next BMD next year.  Pelvic US in one year.   Follow up annually and prn.   After visit summary provided.

## 2019-05-17 NOTE — Patient Instructions (Signed)

## 2019-05-31 ENCOUNTER — Telehealth: Payer: Self-pay | Admitting: Obstetrics and Gynecology

## 2019-05-31 NOTE — Telephone Encounter (Signed)
Please contact patient regarding her use of Risedronate.   I just received notification from [facility] regarding bisphosphonates as a potential cause of scleritis and episcleritis.  These conditions may resolve with discontinuation of the risedronate.   I recommend she contact her eye specialist.   We may need for her to see an endocrinologist to discuss alternatives to her osteoporosis medication, such as Prolia.

## 2019-06-01 NOTE — Telephone Encounter (Signed)
Call to patient. Message given to patient as seen below from Dr. Quincy Simmonds. Patient verbalized understanding. Patient states that she has been taken off of the steroid eye drops she was on for 7-8 months because the scleritis seems to be improving. Patient states she will follow up with her eye specialist, Dr. Heather [place], and will have OV notes sent to our office following appointment for Dr. Elza Rafter review.   Routing to provider and will close encounter.

## 2019-06-17 ENCOUNTER — Encounter: Payer: Self-pay | Admitting: Obstetrics and Gynecology

## 2019-06-17 LAB — HM MAMMOGRAPHY

## 2019-07-15 ENCOUNTER — Encounter: Payer: Self-pay | Admitting: Family Medicine

## 2019-08-11 ENCOUNTER — Ambulatory Visit (INDEPENDENT_AMBULATORY_CARE_PROVIDER_SITE_OTHER): Payer: 59

## 2019-08-11 DIAGNOSIS — Z23 Encounter for immunization: Secondary | ICD-10-CM | POA: Diagnosis not present

## 2019-12-21 ENCOUNTER — Other Ambulatory Visit: Payer: Self-pay | Admitting: Family Medicine

## 2019-12-21 DIAGNOSIS — E785 Hyperlipidemia, unspecified: Secondary | ICD-10-CM

## 2019-12-21 DIAGNOSIS — M810 Age-related osteoporosis without current pathological fracture: Secondary | ICD-10-CM

## 2019-12-29 ENCOUNTER — Other Ambulatory Visit: Payer: Self-pay

## 2019-12-29 ENCOUNTER — Other Ambulatory Visit (INDEPENDENT_AMBULATORY_CARE_PROVIDER_SITE_OTHER): Payer: 59

## 2019-12-29 DIAGNOSIS — E785 Hyperlipidemia, unspecified: Secondary | ICD-10-CM | POA: Diagnosis not present

## 2019-12-29 DIAGNOSIS — M810 Age-related osteoporosis without current pathological fracture: Secondary | ICD-10-CM

## 2019-12-29 LAB — COMPREHENSIVE METABOLIC PANEL
ALT: 12 U/L (ref 0–35)
AST: 15 U/L (ref 0–37)
Albumin: 4.1 g/dL (ref 3.5–5.2)
Alkaline Phosphatase: 41 U/L (ref 39–117)
BUN: 14 mg/dL (ref 6–23)
CO2: 31 mEq/L (ref 19–32)
Calcium: 10 mg/dL (ref 8.4–10.5)
Chloride: 103 mEq/L (ref 96–112)
Creatinine, Ser: 0.86 mg/dL (ref 0.40–1.20)
GFR: 66.52 mL/min (ref >60.00)
Glucose, Bld: 92 mg/dL (ref 70–99)
Potassium: 4.3 mEq/L (ref 3.5–5.1)
Sodium: 139 mEq/L (ref 135–145)
Total Bilirubin: 0.5 mg/dL (ref 0.2–1.2)
Total Protein: 6.4 g/dL (ref 6.0–8.3)

## 2019-12-29 LAB — LIPID PANEL
Cholesterol: 218 mg/dL — ABNORMAL HIGH (ref 0–200)
HDL: 72.2 mg/dL (ref >39.00)
LDL Cholesterol: 131 mg/dL — ABNORMAL HIGH (ref 0–99)
NonHDL: 145.85
Total CHOL/HDL Ratio: 3
Triglycerides: 75 mg/dL (ref 0.0–149.0)
VLDL: 15 mg/dL (ref 0.0–40.0)

## 2019-12-29 LAB — VITAMIN D 25 HYDROXY (VIT D DEFICIENCY, FRACTURES): VITD: 42.83 ng/mL (ref 30.00–100.00)

## 2020-01-02 ENCOUNTER — Encounter: Payer: 59 | Admitting: Family Medicine

## 2020-01-05 ENCOUNTER — Encounter: Payer: Self-pay | Admitting: Family Medicine

## 2020-01-05 ENCOUNTER — Ambulatory Visit (INDEPENDENT_AMBULATORY_CARE_PROVIDER_SITE_OTHER): Payer: 59 | Admitting: Family Medicine

## 2020-01-05 ENCOUNTER — Other Ambulatory Visit: Payer: Self-pay

## 2020-01-05 VITALS — BP 102/72 | HR 80 | Temp 96.7°F | Ht 66.5 in | Wt 122.0 lb

## 2020-01-05 DIAGNOSIS — E785 Hyperlipidemia, unspecified: Secondary | ICD-10-CM

## 2020-01-05 DIAGNOSIS — Z0000 Encounter for general adult medical examination without abnormal findings: Secondary | ICD-10-CM

## 2020-01-05 DIAGNOSIS — M810 Age-related osteoporosis without current pathological fracture: Secondary | ICD-10-CM

## 2020-01-05 DIAGNOSIS — Z7189 Other specified counseling: Secondary | ICD-10-CM

## 2020-01-05 NOTE — Patient Instructions (Signed)
Keep going as is.  Update me as needed. Thanks for your effort.  Take care.  Glad to see you.

## 2020-01-05 NOTE — Progress Notes (Signed)
This visit occurred during the SARS-CoV-2 public health emergency.  Safety protocols were in place, including screening questions prior to the visit, additional usage of staff PPE, and extensive cleaning of exam room while observing appropriate contact time as indicated for disinfecting solutions.  CPE- See plan.  Routine anticipatory guidance given to patient.  See health maintenance.  The possibility exists that previously documented standard health maintenance information may have been brought forward from a previous encounter into this note.  If needed, that same information has been updated to reflect the current situation based on today's encounter.    Tetanus 2014 Flu 2020 PNA not due yet, d/w pt.  Shingrix prev done.  covid vaccine d/w pt.   Colonoscopy 2018  Living will d/w pt. Husband designated if patient were incapacitated.  Diet and exercise d/w pt.  Pap per gyn clinic.  Mammogram per gyn clinic.  Labs d/w pt.     She was on steroids prev for scleritis.  She is off actonel with possible interaction between actonel and scleritis.  I'll defer to [facility] clinic.  She agrees.  Her eye sx are better.    Her brother in law died of a PE last year.  Condolences offered. Her sister is 12 and patient is trying to help her with the adjustment.  This is the one year anniversary of his death.  Her sister is in counseling.   She is still caring for her mother who also had a PE in the last year.    The 10-year ASCVD risk score Mikey Bussing DC Brooke Bonito., et al., 2013) is: 2.7%   Values used to calculate the score:     Age: 64 years     Sex: Female     Is Non-Hispanic African American: No     Diabetic: No     Tobacco smoker: No     Systolic Blood Pressure: A999333 mmHg     Is BP treated: No     HDL Cholesterol: 72.2 mg/dL     Total Cholesterol: 218 mg/dL  PMH and SH reviewed  Meds, vitals, and allergies reviewed.   ROS: Per HPI.  Unless specifically indicated otherwise in HPI, the patient  denies:  General: fever. Eyes: acute vision changes ENT: sore throat Cardiovascular: chest pain Respiratory: SOB GI: vomiting GU: dysuria Musculoskeletal: acute back pain Derm: acute rash Neuro: acute motor dysfunction Psych: worsening mood Endocrine: polydipsia Heme: bleeding Allergy: hayfever  GEN: nad, alert and oriented HEENT: ncat NECK: supple w/o LA CV: rrr. PULM: ctab, no inc wob ABD: soft, +bs EXT: no edema SKIN: no acute rash

## 2020-01-08 NOTE — Assessment & Plan Note (Signed)
Living will d/w pt.  Husband designated if patient were incapacitated.  

## 2020-01-08 NOTE — Assessment & Plan Note (Signed)
With low ASCVD score.  Continue as is with diet and exercise.  She agrees  The 10-year ASCVD risk score Mikey Bussing DC Brooke Bonito., et al., 2013) is: 2.7%   Values used to calculate the score:     Age: 63 years     Sex: Female     Is Non-Hispanic African American: No     Diabetic: No     Tobacco smoker: No     Systolic Blood Pressure: A999333 mmHg     Is BP treated: No     HDL Cholesterol: 72.2 mg/dL     Total Cholesterol: 218 mg/dL

## 2020-01-08 NOTE — Assessment & Plan Note (Signed)
Tetanus 2014 Flu 2020 PNA not due yet, d/w pt.  Shingrix prev done.  covid vaccine d/w pt.   Colonoscopy 2018  Living will d/w pt. Husband designated if patient were incapacitated.  Diet and exercise d/w pt.  Pap per gyn clinic.  Mammogram per gyn clinic.  Labs d/w pt.     She was on steroids prev for scleritis.  She is off actonel with possible interaction between actonel and scleritis.  I'll defer to [facility] clinic.  She agrees.  Her eye sx are better.    Her brother in law died of a PE last year.  Condolences offered. Her sister is 11 and patient is trying to help her with the adjustment.  This is the one year anniversary of his death.  Her sister is in counseling.   She is still caring for her mother who also had a PE in the last year.   This with her mother's first event and her mother is sedentary so there is no thought that the patient has an inherited tendency towards thrombosis.  Discussed.

## 2020-01-08 NOTE — Assessment & Plan Note (Signed)
  She was on steroids prev for scleritis.  She is off actonel with possible interaction between actonel and scleritis.  I'll defer to [facility] clinic.  She agrees.  Her eye sx are better.

## 2020-03-27 ENCOUNTER — Encounter: Payer: Self-pay | Admitting: Family Medicine

## 2020-04-05 ENCOUNTER — Telehealth: Payer: Self-pay | Admitting: Obstetrics and Gynecology

## 2020-04-05 NOTE — Telephone Encounter (Signed)
Please contact patient with her results of her BMD showing osteoporosis of the hip.   The bone density in her left hip is decreasing significantly.   Her spine bone density is normal.  Would she like to receive her care for her bone density at [facility] or see an endocrinologist here in [place]?

## 2020-04-06 NOTE — Telephone Encounter (Signed)
To Marisa Sprinkles, CMA to contact patient.

## 2020-04-06 NOTE — Telephone Encounter (Signed)
The following results were discussed in detail with patient as seen below. Per patient, has an appointment her endocrinologist Dr. Gwen Her. Weber at [facility] on next week. Called [facility] to ensure that they would send BMD results to patient's endocrinologist. Patient verbalizes understanding and is agreeable.

## 2020-04-09 NOTE — Telephone Encounter (Signed)
Encounter reviewed and closed.  

## 2020-04-10 ENCOUNTER — Telehealth: Payer: Self-pay

## 2020-04-10 NOTE — Telephone Encounter (Signed)
Patient contacted the office and she states that she talked with [place] about her care regarding Osteoporosis. She states she was seen today by Dr. Titus Dubin at [facility], and he specializes in bone health. She is going to proceed with her care for this at their facility. FYI.

## 2020-04-11 NOTE — Telephone Encounter (Signed)
Noted.  Thanks.  I will defer.  I will await the consult note.

## 2020-06-05 ENCOUNTER — Other Ambulatory Visit: Payer: Self-pay

## 2020-06-05 ENCOUNTER — Encounter: Payer: Self-pay | Admitting: Family Medicine

## 2020-06-05 ENCOUNTER — Telehealth (INDEPENDENT_AMBULATORY_CARE_PROVIDER_SITE_OTHER): Payer: 59 | Admitting: Family Medicine

## 2020-06-05 DIAGNOSIS — J069 Acute upper respiratory infection, unspecified: Secondary | ICD-10-CM

## 2020-06-05 MED ORDER — AMOXICILLIN-POT CLAVULANATE 875-125 MG PO TABS: 1.0000 | ORAL_TABLET | Freq: Two times a day (BID) | ORAL | 0 refills | Status: DC

## 2020-06-05 NOTE — Progress Notes (Signed)
Virtual visit completed through WebEx or similar program Patient location: home  Provider location: [place] at [facility], office  Participants: Patient and me (unless stated otherwise below)  Pandemic considerations d/w pt.   Limitations and rationale for visit method d/w patient.  Patient agreed to proceed.   CC: URI.    HPI: 115/71 p70 on home check.    Sx started about 4 days ago.  Scratchy throat.  Started taking OTC meds w/o relief.  Sleep interrupted.  Cough.  Fatigued.  ST.  Then had facial pain, ear pressure.  Upper tooth pain.  Rhinorrhea recently.  Sleeping sitting up to help with cough.  Not SOB.  Most bothered by ear and facial pressure.  No fevers.    She had a covid test today, results pending.  She had been vaccinated.  No known covid exposures.    Meds and allergies reviewed.   ROS: Per HPI unless specifically indicated in ROS section   NAD Speech wnl  A/P: Presumed sinusitis.  Nontoxic.   covid test pending.   Start augmentin, rest and fluids.  Update me as needed.   Continue robitussin for cough, prn.  She agrees with plan.

## 2020-06-06 DIAGNOSIS — J069 Acute upper respiratory infection, unspecified: Secondary | ICD-10-CM | POA: Insufficient documentation

## 2020-06-06 NOTE — Assessment & Plan Note (Signed)
Presumed sinusitis.  Nontoxic.   covid test pending.   Start augmentin, rest and fluids.  Update me as needed.   Continue robitussin for cough, prn.  She agrees with plan.

## 2020-06-07 ENCOUNTER — Telehealth: Payer: Self-pay

## 2020-06-07 NOTE — Telephone Encounter (Signed)
Glad covid neg.  Would continue augmentin.  Please update me as needed.  Would expect her to feel better soon.   Thanks.

## 2020-06-07 NOTE — Telephone Encounter (Signed)
Pt left v/m requesting cb; pt had virtual visit on 06/05/20 and got med for sinus infection. Pt called back with covid results as being negative. Pt started augmentin on 06/05/20 after taking covid test that morning.(pt did get 2 doses of the augmentin on 06/05/20). Pt said she is starting to feel better and her head is opening up; pt having a lot of drainage and sneezing but pt does not have facial pressure that she had the other day. Pt said she was supposed to let Dr Damita Dunnings know the covid test results whether + or -. Pt will continue abx and will wait for cb if Dr Damita Dunnings has further instructions otherwise pt will cb if any concerns or if does not completely clear with symptoms. FYI to Dr Damita Dunnings.

## 2020-06-26 NOTE — Progress Notes (Signed)
64 y.o. G2P1 Married Caucasian female here for annual exam.    Patient seeing endocrinologist, Edmonia James, MD at [facility] for bone health. 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  The plan is for her to have a repeat BMD at [facility].   Sees a retina specialist for scleritis.  It has cleared after treatment with optic steroids.   Denies vaginal bleeding.   She states some abdominal "pooching out" since her inguinal surgery.   Just back from the beach.   Covid vaccine completed on February 03, 2020.   PCP:  Elsie Stain, MD   Patient's last menstrual period was 05/27/2009.           Sexually active: No.  The current method of family planning is post menopausal status.    Exercising: Yes.    aerobics, weights and yoga Smoker:  no  Health Maintenance: Pap: 01-21-17 Neg:Neg HR HPV, 12-16-13 Neg:Neg HR HPV History of abnormal Pap:  Yes, 06/2005 Acus/Pos HR HPV, colposcopy 07-25-05 showed atypia on cervical bx and ECC negative. No Treatment to cervix and paps normal since. MMG: 06-22-20 with [facility]--will call for report Colonoscopy: 11-24-16 normal;next due 10/2026 BMD: 03-27-20 Result :Osteoporosis--Significant decrease in left hip TDaP:  2014 Gardasil:   no HIV: 12-09-16 NR Hep C: 12-09-16 Neg Screening Labs:  PCP.  Normal per patient.  Shingrix:  Completed.    reports that she has never smoked. She has never used smokeless tobacco. She reports current alcohol use. She reports that she does not use drugs.  Past Medical History:  Diagnosis Date  . Anemia   . ASCUS on Pap smear 2006   Asc-us pap w/HRHPV  . Breast cyst 1998 1999   2005 x3  . Endometriosis   . Family history of alcoholism   . Family history of diabetes mellitus    1st degree relative  . Femoral hernia    right  . Nodular scleritis of right eye   . Osteoporosis 2016   started actonel 2016    Past Surgical History:  Procedure Laterality Date  . Abdominal endometriosis  1991   removal right  endometrioma  . Breast biopsy and benign lump removal Left 1992   Breast Bx atypia (L)  . BUNIONECTOMY  2011 and 2012  . COLONOSCOPY    . INGUINAL HERNIA REPAIR  05/2014   Dr. Barkley Bruns  . LAPAROSCOPY  1990   endometriosis  . TONSILLECTOMY      Current Outpatient Medications  Medication Sig Dispense Refill  . Calcium Carb-Cholecalciferol (CALTRATE 600+D) 600-800 MG-UNIT TABS Take by mouth 2 (two) times daily.    . clindamycin (CLEOCIN T) 1 % lotion Apply 1 application topically as needed.    . Multiple Vitamin (MULTIVITAMIN) tablet Take 1 tablet by mouth daily.    . Vitamin D, Cholecalciferol, 50 MCG (2000 UT) CAPS Take by mouth daily.     No current facility-administered medications for this visit.    Family History  Problem Relation Age of Onset  . Arthritis Mother   . Hyperlipidemia Mother   . Hypertension Mother   . Parkinsonism Mother   . Heart disease Mother   . Pulmonary embolism Mother   . Arthritis Father   . Hyperlipidemia Father   . Hypertension Father   . Heart disease Father        CAD (6 CABG)  . Stroke Father   . Diabetes Sister   . Alcohol abuse Brother        dec  58 09/2015 drugs & alcohol  . Arthritis Other   . Hypertension Other   . Heart disease Other   . Colon cancer Neg Hx   . Breast cancer Neg Hx     Review of Systems  All other systems reviewed and are negative.   Exam:   BP 124/68   Pulse 70   Resp 14   Ht 5' 6.5" (1.689 m)   Wt 120 lb (54.4 kg)   LMP 05/27/2009   BMI 19.08 kg/m     General appearance: alert, cooperative and appears stated age Head: normocephalic, without obvious abnormality, atraumatic Neck: no adenopathy, supple, symmetrical, trachea midline and thyroid normal to inspection and palpation Lungs: clear to auscultation bilaterally Breasts: normal appearance, no masses or tenderness, No nipple retraction or dimpling, No nipple discharge or bleeding, No axillary adenopathy Heart: regular rate and rhythm Abdomen:  soft, non-tender; no masses, no organomegaly Extremities: extremities normal, atraumatic, no cyanosis or edema Skin: skin color, texture, turgor normal. No rashes or lesions Lymph nodes: cervical, supraclavicular, and axillary nodes normal. Neurologic: grossly normal  Pelvic: External genitalia:  no lesions              No abnormal inguinal nodes palpated.              Urethra:  normal appearing urethra with no masses, tenderness or lesions              Bartholins and Skenes: normal                 Vagina: normal appearing vagina with normal color and discharge, no lesions              Cervix: no lesions              Pap taken: No. Bimanual Exam:  Uterus:  Fundal fibroid about 2.5 cm, mobile, nontender.              Adnexa: no mass, fullness, tenderness              Rectal exam: Yes.  .  Confirms.              Anus:  normal sphincter tone, no lesions  Chaperone was present for exam.  Assessment:   Well woman visit with normal exam. Remote hx of abnormal pap.  Osteoporosis.  Endocrinology following.  Stable uterine fibroid.  Plan: Mammogram screening discussed. Self breast awareness reviewed. Pap and HR HPV in 2022 or 2023.  I discussed Medicare benefits for pap every 2 years.  Guidelines for Calcium, Vitamin D, regular exercise program including cardiovascular and weight bearing exercise. Labs with PCP.  Follow up annually and prn.   After visit summary provided.

## 2020-06-27 ENCOUNTER — Other Ambulatory Visit: Payer: Self-pay

## 2020-06-27 ENCOUNTER — Encounter: Payer: Self-pay | Admitting: Obstetrics and Gynecology

## 2020-06-27 ENCOUNTER — Ambulatory Visit (INDEPENDENT_AMBULATORY_CARE_PROVIDER_SITE_OTHER): Payer: 59 | Admitting: Obstetrics and Gynecology

## 2020-06-27 VITALS — BP 124/68 | HR 70 | Resp 14 | Ht 66.5 in | Wt 120.0 lb

## 2020-06-27 DIAGNOSIS — Z01419 Encounter for gynecological examination (general) (routine) without abnormal findings: Secondary | ICD-10-CM | POA: Diagnosis not present

## 2020-06-27 NOTE — Patient Instructions (Signed)

## 2020-06-29 ENCOUNTER — Encounter: Payer: Self-pay | Admitting: Obstetrics and Gynecology

## 2020-12-29 ENCOUNTER — Other Ambulatory Visit: Payer: Self-pay | Admitting: Family Medicine

## 2020-12-29 DIAGNOSIS — E785 Hyperlipidemia, unspecified: Secondary | ICD-10-CM

## 2020-12-29 DIAGNOSIS — M810 Age-related osteoporosis without current pathological fracture: Secondary | ICD-10-CM

## 2021-01-10 ENCOUNTER — Other Ambulatory Visit: Payer: Self-pay

## 2021-01-10 ENCOUNTER — Other Ambulatory Visit (INDEPENDENT_AMBULATORY_CARE_PROVIDER_SITE_OTHER): Payer: No Typology Code available for payment source

## 2021-01-10 DIAGNOSIS — M810 Age-related osteoporosis without current pathological fracture: Secondary | ICD-10-CM

## 2021-01-10 DIAGNOSIS — E785 Hyperlipidemia, unspecified: Secondary | ICD-10-CM | POA: Diagnosis not present

## 2021-01-10 LAB — COMPREHENSIVE METABOLIC PANEL
ALT: 14 U/L (ref 0–35)
AST: 19 U/L (ref 0–37)
Albumin: 4.3 g/dL (ref 3.5–5.2)
Alkaline Phosphatase: 49 U/L (ref 39–117)
BUN: 18 mg/dL (ref 6–23)
CO2: 28 mEq/L (ref 19–32)
Calcium: 9.8 mg/dL (ref 8.4–10.5)
Chloride: 103 mEq/L (ref 96–112)
Creatinine, Ser: 0.83 mg/dL (ref 0.40–1.20)
GFR: 74.39 mL/min (ref >60.00)
Glucose, Bld: 88 mg/dL (ref 70–99)
Potassium: 4.1 mEq/L (ref 3.5–5.1)
Sodium: 139 mEq/L (ref 135–145)
Total Bilirubin: 0.7 mg/dL (ref 0.2–1.2)
Total Protein: 6.9 g/dL (ref 6.0–8.3)

## 2021-01-10 LAB — LIPID PANEL
Cholesterol: 219 mg/dL — ABNORMAL HIGH (ref 0–200)
HDL: 85.5 mg/dL (ref >39.00)
LDL Cholesterol: 121 mg/dL — ABNORMAL HIGH (ref 0–99)
NonHDL: 133.85
Total CHOL/HDL Ratio: 3
Triglycerides: 64 mg/dL (ref 0.0–149.0)
VLDL: 12.8 mg/dL (ref 0.0–40.0)

## 2021-01-10 LAB — VITAMIN D 25 HYDROXY (VIT D DEFICIENCY, FRACTURES): VITD: 47.13 ng/mL (ref 30.00–100.00)

## 2021-01-17 ENCOUNTER — Other Ambulatory Visit: Payer: Self-pay

## 2021-01-17 ENCOUNTER — Ambulatory Visit (INDEPENDENT_AMBULATORY_CARE_PROVIDER_SITE_OTHER): Payer: No Typology Code available for payment source | Admitting: Family Medicine

## 2021-01-17 ENCOUNTER — Encounter: Payer: Self-pay | Admitting: Family Medicine

## 2021-01-17 VITALS — BP 106/70 | HR 94 | Temp 97.8°F | Ht 66.0 in | Wt 120.0 lb

## 2021-01-17 DIAGNOSIS — E785 Hyperlipidemia, unspecified: Secondary | ICD-10-CM

## 2021-01-17 DIAGNOSIS — Z0000 Encounter for general adult medical examination without abnormal findings: Secondary | ICD-10-CM

## 2021-01-17 DIAGNOSIS — Z7189 Other specified counseling: Secondary | ICD-10-CM

## 2021-01-17 DIAGNOSIS — M810 Age-related osteoporosis without current pathological fracture: Secondary | ICD-10-CM

## 2021-01-17 NOTE — Patient Instructions (Signed)
Thanks for your effort.  Your labs are great.  Update me as needed.  Take care.  Glad to see you.

## 2021-01-17 NOTE — Progress Notes (Signed)
This visit occurred during the SARS-CoV-2 public health emergency.  Safety protocols were in place, including screening questions prior to the visit, additional usage of staff PPE, and extensive cleaning of exam room while observing appropriate contact time as indicated for disinfecting solutions.  CPE- See plan.  Routine anticipatory guidance given to patient.  See health maintenance.  The possibility exists that previously documented standard health maintenance information may have been brought forward from a previous encounter into this note.  If needed, that same information has been updated to reflect the current situation based on today's encounter.    Her son is doing well with dermatology clinic work.   Husband has prostate cancer.  He met with rady onc and surgery onc.  He opted for external radiation.    Tetanus 2014 Flu 2021 PNA not due yet, d/w pt.  Shingrix prev done.  covid vaccine up to date.   Colonoscopy 2018  Living will d/w pt. Husband designated if patient were incapacitated.  Diet and exercise d/w pt.  Pap per gyn clinic.  Mammogram per gyn clinic. Labs d/w pt.     She had seen [place] [facility] about osteoporosis.  I'll defer.  She agrees.   She had eye clinic follow up in the meantime.     She is still caring for her mother who also had a PE and requires hoyer lift/van for transport.  Her mother has parkinsons.  She is at [facility] in [place].   PMH and SH reviewed Meds, vitals, and allergies reviewed.   ROS: Per HPI.  Unless specifically indicated otherwise in HPI, the patient denies:  General: fever. Eyes: acute vision changes ENT: sore throat Cardiovascular: chest pain Respiratory: SOB GI: vomiting GU: dysuria Musculoskeletal: acute back pain Derm: acute rash Neuro: acute motor dysfunction Psych: worsening mood Endocrine: polydipsia Heme: bleeding Allergy: hayfever  GEN: nad, alert and oriented HEENT: ncat NECK: supple w/o LA CV:  rrr. PULM: ctab, no inc wob ABD: soft, +bs EXT: no edema SKIN: no acute rash  The 10-year ASCVD risk score Mikey Bussing DC Jr., et al., 2013) is: 3%   Values used to calculate the score:     Age: 65 years     Sex: Female     Is Non-Hispanic African American: No     Diabetic: No     Tobacco smoker: No     Systolic Blood Pressure: 469 mmHg     Is BP treated: No     HDL Cholesterol: 85.5 mg/dL     Total Cholesterol: 219 mg/dL

## 2021-01-20 NOTE — Assessment & Plan Note (Signed)
  She had seen [facility] about osteoporosis.  I'll defer.  She agrees.

## 2021-01-20 NOTE — Assessment & Plan Note (Signed)
Living will d/w pt.  Husband designated if patient were incapacitated.  

## 2021-01-20 NOTE — Assessment & Plan Note (Signed)
Given her ASCVD score, would continue work on diet and exercise and not start medication.  Rationale discussed with patient.  She agreed.

## 2021-01-20 NOTE — Assessment & Plan Note (Signed)
  Her son is doing well with dermatology clinic work.   Husband has prostate cancer.  He met with rady onc and surgery onc.  He opted for external radiation.   ================= Tetanus 2014 Flu 2021 PNA not due yet, d/w pt.  Shingrix prev done.  covid vaccine up to date.   Colonoscopy 2018  Living will d/w pt. Husband designated if patient were incapacitated.  Diet and exercise d/w pt.  Pap per gyn clinic.  Mammogram per gyn clinic. Labs d/w pt.     She had eye clinic follow up in the meantime.     She is still caring for her mother who also had a PE and requires hoyer lift/van for transport.  Her mother has parkinsons.  She is at [facility] in [place].  Support offered.

## 2021-07-23 NOTE — Progress Notes (Signed)
GYNECOLOGY  VISIT   HPI: 65 y.o.   Married  Caucasian  female   G2P1 with Patient's last menstrual period was 05/27/2009.   here for breast and pelvic exam.  Asking about her uterine fibroid.  She had a follow up subserousal fibroid 28 mm, stable, with her last pelvic US 05/12/19. No pelvic pain or vaginal bleeding.   She is followed for osteoporosis and is off all medication.  Exercising regularly.   Doing Invisialine.  Asking about new Covid booster.   Mother with Parkinson's. Husband dx with early prostate cancer.   GYNECOLOGIC HISTORY: Patient's last menstrual period was 05/27/2009. Contraception:  PMP Menopausal hormone therapy:  none Last mammogram:  05/2021 normal with [facility] Last pap smear:  01-21-17 Neg:Neg HR HPV, 12-16-13 Neg:Neg HR HPV        OB History     Gravida  2   Para  1   Term      Preterm      AB      Living  1      SAB      IAB      Ectopic      Multiple      Live Births                 Patient Active Problem List   Diagnosis Date Noted   URI (upper respiratory infection) 06/06/2020   Strain of back 03/15/2019   Scleritis 12/27/2018   Fibroid 02/18/2018   HLD (hyperlipidemia) 12/20/2017   Routine general medical examination at a health care facility 12/17/2016   Advance care planning 12/17/2016   Osteoporosis 09/16/2010    Past Medical History:  Diagnosis Date   Anemia    ASCUS on Pap smear 2006   Asc-us pap w/HRHPV   Breast cyst 1998 1999   2005 x3   Endometriosis    Family history of alcoholism    Family history of diabetes mellitus    1st degree relative   Femoral hernia    right   Nodular scleritis of right eye    Osteoporosis 2016   started actonel 2016    Past Surgical History:  Procedure Laterality Date   Abdominal endometriosis  1991   removal right endometrioma   Breast biopsy and benign lump removal Left 1992   Breast Bx atypia (L)   BUNIONECTOMY  2011 and 2012   COLONOSCOPY     INGUINAL HERNIA  REPAIR  05/2014   Dr. Barkley Bruns   LAPAROSCOPY  1990   endometriosis   TONSILLECTOMY      Current Outpatient Medications  Medication Sig Dispense Refill   Calcium Carb-Cholecalciferol 600-800 MG-UNIT TABS Take by mouth daily. Talking 1 TUMS concurrently     clindamycin (CLEOCIN T) 1 % lotion Apply 1 application topically as needed.     Multiple Vitamin (MULTIVITAMIN) tablet Take 1 tablet by mouth daily.     Vitamin D, Cholecalciferol, 50 MCG (2000 UT) CAPS Take by mouth daily.     No current facility-administered medications for this visit.     ALLERGIES: Actonel [risedronate sodium]  Family History  Problem Relation Age of Onset   Arthritis Mother    Hyperlipidemia Mother    Hypertension Mother    Parkinsonism Mother    Heart disease Mother    Pulmonary embolism Mother    Arthritis Father    Hyperlipidemia Father    Hypertension Father    Heart disease Father  CAD (6 CABG)   Stroke Father    Diabetes Sister    Alcohol abuse Brother        dec 58 09/2015 drugs & alcohol   Arthritis Other    Hypertension Other    Heart disease Other    Colon cancer Neg Hx    Breast cancer Neg Hx     Social History   Socioeconomic History   Marital status: Married    Spouse name: Not on file   Number of children: 1   Years of education: Not on file   Highest education level: Not on file  Occupational History   Occupation: English as a second language teacher: [employer]  Tobacco Use   Smoking status: Never   Smokeless tobacco: Never  Vaping Use   Vaping Use: Never used  Substance and Sexual Activity   Alcohol use: Yes    Comment: 1 drink/month   Drug use: No   Sexual activity: Not Currently    Partners: Male    Birth control/protection: Post-menopausal  Other Topics Concern   Not on file  Social History Narrative   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   Social Determinants of Health   Financial Resource Strain: Not on file  Food Insecurity: Not on file  Transportation Needs: Not on file  Physical Activity: Not on file  Stress: Not on file  Social Connections: Not on file  Intimate Partner Violence: Not on file    Review of Systems  All other systems reviewed and are negative.  PHYSICAL EXAMINATION:    BP 110/62   Pulse 80   Ht 5' 6.5" (1.689 m)   Wt 122 lb (55.3 kg)   LMP 05/27/2009   SpO2 98%   BMI 19.40 kg/m     General appearance: alert, cooperative and appears stated age Head: Normocephalic, without obvious abnormality, atraumatic Neck: no adenopathy, supple, symmetrical, trachea midline and thyroid normal to inspection and palpation Lungs: clear to auscultation bilaterally Breasts: normal appearance, no masses or tenderness, No nipple retraction or dimpling, No nipple discharge or bleeding, No axillary or supraclavicular adenopathy Heart: regular rate and rhythm Abdomen: soft, non-tender, no masses,  no organomegaly Extremities: extremities normal, atraumatic, no cyanosis or edema Skin: Skin color, texture, turgor normal. No rashes or lesions Lymph nodes: Cervical, supraclavicular, and axillary nodes normal. No abnormal inguinal nodes palpated Neurologic: Grossly normal  Pelvic: External genitalia:  no lesions              Urethra:  normal appearing urethra with no masses, tenderness or lesions              Bartholins and Skenes: normal                 Vagina: normal appearing vagina with normal color and discharge, no lesions              Cervix: no lesions                Bimanual Exam:  Uterus:  small.  2.5 cm anterior fibroid noted.               Adnexa: no mass, fullness, tenderness  Rectal exam: yes.  Confirms.              Anus:  normal sphincter  tone, no lesions  Chaperone was present for exam:  Estill Bamberg, CMA  ASSESSMENT  Well woman visit.  Remote hx of abnormal pap.  Osteoporosis.  Endocrinology following.  Stable uterine fibroid. Vaccine counseling.   PLAN  Pap and HR HPV collected.  Yearly mammogram.  Self breast exam encouraged.  BMD in June, 2023.  She may start to do this directly with her PCP.  We talked about the differences of switching to a new BMD machine. Continue vit D and calcium along with weight bearing exercise.  We discussed the new bivalent Covid booster.  Fu in 2 years and prn.   An After Visit Summary was printed and given to the patient.  31 min  total time was spent for this patient encounter, including preparation, face-to-face counseling with the patient, coordination of care, and documentation of the encounter.

## 2021-07-24 ENCOUNTER — Ambulatory Visit (INDEPENDENT_AMBULATORY_CARE_PROVIDER_SITE_OTHER): Payer: Medicare Other | Admitting: Obstetrics and Gynecology

## 2021-07-24 ENCOUNTER — Encounter: Payer: Self-pay | Admitting: Obstetrics and Gynecology

## 2021-07-24 ENCOUNTER — Other Ambulatory Visit: Payer: Self-pay

## 2021-07-24 ENCOUNTER — Other Ambulatory Visit (HOSPITAL_COMMUNITY)
Admission: RE | Admit: 2021-07-24 | Discharge: 2021-07-24 | Disposition: A | Discharge status: Home / Self Care | Payer: Medicare Other | Source: Ambulatory Visit | Attending: Obstetrics and Gynecology | Admitting: Obstetrics and Gynecology

## 2021-07-24 VITALS — BP 110/62 | HR 80 | Ht 66.5 in | Wt 122.0 lb

## 2021-07-24 DIAGNOSIS — Z124 Encounter for screening for malignant neoplasm of cervix: Secondary | ICD-10-CM

## 2021-07-24 DIAGNOSIS — Z1151 Encounter for screening for human papillomavirus (HPV): Secondary | ICD-10-CM | POA: Diagnosis not present

## 2021-07-24 DIAGNOSIS — Z7185 Encounter for immunization safety counseling: Secondary | ICD-10-CM

## 2021-07-24 DIAGNOSIS — Z01419 Encounter for gynecological examination (general) (routine) without abnormal findings: Secondary | ICD-10-CM

## 2021-07-24 DIAGNOSIS — D259 Leiomyoma of uterus, unspecified: Secondary | ICD-10-CM

## 2021-07-24 NOTE — Patient Instructions (Signed)

## 2021-07-26 LAB — CYTOLOGY - PAP
Comment: NEGATIVE
Diagnosis: NEGATIVE
High risk HPV: NEGATIVE

## 2021-08-25 ENCOUNTER — Ambulatory Visit
Admission: EM | Admit: 2021-08-25 | Discharge: 2021-08-25 | Disposition: A | Discharge status: Home / Self Care | Payer: Medicare Other | Attending: Physician Assistant | Admitting: Physician Assistant

## 2021-08-25 ENCOUNTER — Ambulatory Visit (INDEPENDENT_AMBULATORY_CARE_PROVIDER_SITE_OTHER): Payer: Medicare Other

## 2021-08-25 ENCOUNTER — Other Ambulatory Visit: Payer: Self-pay

## 2021-08-25 DIAGNOSIS — S62511A Displaced fracture of proximal phalanx of right thumb, initial encounter for closed fracture: Secondary | ICD-10-CM | POA: Diagnosis not present

## 2021-08-25 NOTE — ED Triage Notes (Signed)
Pt present right hand/ thumb injury. Pt states that a cart was rolling back and she reached out to grab the cart and it bend her thumb back. Pt state that she is unable to bear weight or pressure on the thumb with limit movement. Pt has been icing the area as well.

## 2021-08-25 NOTE — ED Provider Notes (Signed)
[facility]    CSN: [id number] Arrival date & time: 08/25/21  1234      History   Chief Complaint Chief Complaint  Patient presents with   Finger Injury    HPI Sabrina Hansen is a 65 y.o. female.   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  She states that a shopping cart was rolling away and she went to catch it at which time her right thumb was hyperextended.  She did have some pain at the time but pain has gradually worsened with time and swelling is worse this morning.  She has tried ice without significant relief.  Movement and use of makes pain worse.  The history is provided by the patient.   Past Medical History:  Diagnosis Date   Anemia    ASCUS on Pap smear 2006   Asc-us pap w/HRHPV   Breast cyst 1998 1999   2005 x3   Endometriosis    Family history of alcoholism    Family history of diabetes mellitus    1st degree relative   Femoral hernia    right   Nodular scleritis of right eye    Osteoporosis 2016   started actonel 2016    Patient Active Problem List   Diagnosis Date Noted   URI (upper respiratory infection) 06/06/2020   Strain of back 03/15/2019   Scleritis 12/27/2018   Fibroid 02/18/2018   HLD (hyperlipidemia) 12/20/2017   Routine general medical examination at a health care facility 12/17/2016   Advance care planning 12/17/2016   Osteoporosis 09/16/2010    Past Surgical History:  Procedure Laterality Date   Abdominal endometriosis  1991   removal right endometrioma   Breast biopsy and benign lump removal Left 1992   Breast Bx atypia (L)   BUNIONECTOMY  2011 and 2012   COLONOSCOPY     INGUINAL HERNIA REPAIR  05/2014   Dr. Barkley Bruns   LAPAROSCOPY  1990   endometriosis   TONSILLECTOMY      OB History     Gravida  2   Para  1   Term      Preterm      AB      Living  1      SAB      IAB      Ectopic       Multiple      Live Births               Home Medications    Prior to Admission medications   Medication Sig Start Date End Date Taking? Authorizing Provider  Calcium Carb-Cholecalciferol 600-800 MG-UNIT TABS Take by mouth daily. Talking 1 TUMS concurrently    [provider]  clindamycin (CLEOCIN T) 1 % lotion Apply 1 application topically as needed. 03/20/20   [provider]  Multiple Vitamin (MULTIVITAMIN) tablet Take 1 tablet by mouth daily.    [provider]  Vitamin D, Cholecalciferol, 50 MCG (2000 UT) CAPS Take by mouth daily.    [provider]    Family History Family History  Problem Relation Age of Onset   Arthritis Mother    Hyperlipidemia Mother    Hypertension Mother    Parkinsonism Mother    Heart disease Mother    Pulmonary embolism Mother    Arthritis Father    Hyperlipidemia Father    Hypertension Father  Heart disease Father        CAD (75 CABG)   Stroke Father    Diabetes Sister    Alcohol abuse Brother        dec 58 09/2015 drugs & alcohol   Arthritis Other    Hypertension Other    Heart disease Other    Colon cancer Neg Hx    Breast cancer Neg Hx     Social History Social History   Tobacco Use   Smoking status: Never   Smokeless tobacco: Never  Vaping Use   Vaping Use: Never used  Substance Use Topics   Alcohol use: Yes    Comment: 1 drink/month   Drug use: No     Allergies   Actonel [risedronate sodium]   Review of Systems Review of Systems  Constitutional:  Negative for chills and fever.  Eyes:  Negative for discharge and redness.  Respiratory:  Negative for shortness of breath.   Gastrointestinal:  Negative for abdominal pain, nausea and vomiting.  Genitourinary:  Positive for vaginal bleeding and vaginal discharge.  Musculoskeletal:  Positive for arthralgias and joint swelling.  Neurological:  Negative for numbness.    Physical Exam Triage Vital Signs ED Triage Vitals  Enc  Vitals Group     BP 08/25/21 1408 119/74     Pulse Rate 08/25/21 1408 75     Resp 08/25/21 1408 16     Temp 08/25/21 1408 98.3 F (36.8 C)     Temp Source 08/25/21 1408 Oral     SpO2 08/25/21 1408 97 %     Weight --      Height --      Head Circumference --      Peak Flow --      Pain Score 08/25/21 1407 2     Pain Loc --      Pain Edu? --      Excl. in [place]? --    No data found.  Updated Vital Signs BP 119/74 (BP Location: Left Arm)   Pulse 75   Temp 98.3 F (36.8 C) (Oral)   Resp 16   LMP 05/27/2009   SpO2 97%   Physical Exam Vitals and nursing note reviewed.  Constitutional:      General: She is not in acute distress.    Appearance: Normal appearance. She is not ill-appearing.  HENT:     Head: Normocephalic and atraumatic.  Eyes:     Conjunctiva/sclera: Conjunctivae normal.  Cardiovascular:     Rate and Rhythm: Normal rate.  Pulmonary:     Effort: Pulmonary effort is normal.  Musculoskeletal:     Comments: Diffuse swelling and TTP to base of right thumb, decreased ROM due to pain, normal ROM of right wrist.   Skin:    General: Skin is warm and dry.     Capillary Refill: Normal cap refill to right thumb distally Neurological:     Mental Status: She is alert.     Comments: Gross sensation intact to distal right thumb  Psychiatric:        Mood and Affect: Mood normal.        Behavior: Behavior normal.        Thought Content: Thought content normal.     UC Treatments / Results  Labs (all labs ordered are listed, but only abnormal results are displayed) Labs Reviewed - No data to display  EKG   Radiology DG Finger Thumb Right  Result Date: 08/25/2021 CLINICAL DATA:  Hyperintense  tension of right thumb. EXAM: RIGHT THUMB 2+V COMPARISON:  None. FINDINGS: Intra-articular fracture of the lateral base of the first digit proximal phalanx. Associated 4 mm linear fracture fragment. No dislocation. There is no evidence of arthropathy or other focal bone  abnormality. Soft tissues are unremarkable. No retained radiopaque foreign body IMPRESSION: Intra-articular fracture of the lateral base of the first digit proximal phalanx. Electronically Signed   By: Iven Finn M.D.   On: 08/25/2021 15:07    Procedures Procedures (including critical care time)  Medications Ordered in UC Medications - No data to display  Initial Impression / Assessment and Plan / UC Course  I have reviewed the triage vital signs and the nursing notes.  Pertinent labs & imaging results that were available during my care of the patient were reviewed by me and considered in my medical decision making (see chart for details).  X-ray with notable fracture, will place thumb spica splint in office and recommended follow-up with Ortho.  Contact information provided for same.  Encouraged follow-up with any further concerns.  Final Clinical Impressions(s) / UC Diagnoses   Final diagnoses:  Closed displaced fracture of proximal phalanx of right thumb, initial encounter   Discharge Instructions   None    ED Prescriptions   None    PDMP not reviewed this encounter.   Francene Finders, PA-C 08/25/21 [phone]

## 2021-08-27 DIAGNOSIS — M79644 Pain in right finger(s): Secondary | ICD-10-CM | POA: Insufficient documentation

## 2022-01-15 ENCOUNTER — Other Ambulatory Visit: Payer: Self-pay | Admitting: Family Medicine

## 2022-01-15 DIAGNOSIS — M810 Age-related osteoporosis without current pathological fracture: Secondary | ICD-10-CM

## 2022-01-15 DIAGNOSIS — E785 Hyperlipidemia, unspecified: Secondary | ICD-10-CM

## 2022-01-16 ENCOUNTER — Other Ambulatory Visit (INDEPENDENT_AMBULATORY_CARE_PROVIDER_SITE_OTHER): Payer: Medicare Other

## 2022-01-16 ENCOUNTER — Other Ambulatory Visit: Payer: Self-pay

## 2022-01-16 DIAGNOSIS — M810 Age-related osteoporosis without current pathological fracture: Secondary | ICD-10-CM

## 2022-01-16 DIAGNOSIS — E785 Hyperlipidemia, unspecified: Secondary | ICD-10-CM | POA: Diagnosis not present

## 2022-01-16 LAB — COMPREHENSIVE METABOLIC PANEL
ALT: 12 U/L (ref 0–35)
AST: 16 U/L (ref 0–37)
Albumin: 4.4 g/dL (ref 3.5–5.2)
Alkaline Phosphatase: 47 U/L (ref 39–117)
BUN: 14 mg/dL (ref 6–23)
CO2: 31 mEq/L (ref 19–32)
Calcium: 9.8 mg/dL (ref 8.4–10.5)
Chloride: 104 mEq/L (ref 96–112)
Creatinine, Ser: 0.82 mg/dL (ref 0.40–1.20)
GFR: 74.94 mL/min (ref >60.00)
Glucose, Bld: 93 mg/dL (ref 70–99)
Potassium: 4.3 mEq/L (ref 3.5–5.1)
Sodium: 140 mEq/L (ref 135–145)
Total Bilirubin: 0.7 mg/dL (ref 0.2–1.2)
Total Protein: 6.4 g/dL (ref 6.0–8.3)

## 2022-01-16 LAB — LIPID PANEL
Cholesterol: 211 mg/dL — ABNORMAL HIGH (ref 0–200)
HDL: 85.1 mg/dL (ref >39.00)
LDL Cholesterol: 115 mg/dL — ABNORMAL HIGH (ref 0–99)
NonHDL: 126.37
Total CHOL/HDL Ratio: 2
Triglycerides: 55 mg/dL (ref 0.0–149.0)
VLDL: 11 mg/dL (ref 0.0–40.0)

## 2022-01-16 LAB — VITAMIN D 25 HYDROXY (VIT D DEFICIENCY, FRACTURES): VITD: 38.07 ng/mL (ref 30.00–100.00)

## 2022-01-23 ENCOUNTER — Encounter: Payer: Self-pay | Admitting: Family Medicine

## 2022-01-23 ENCOUNTER — Ambulatory Visit (INDEPENDENT_AMBULATORY_CARE_PROVIDER_SITE_OTHER): Payer: Medicare Other | Admitting: Family Medicine

## 2022-01-23 VITALS — BP 116/62 | HR 77 | Temp 97.4°F | Ht 65.9 in | Wt 119.0 lb

## 2022-01-23 DIAGNOSIS — R0789 Other chest pain: Secondary | ICD-10-CM | POA: Diagnosis not present

## 2022-01-23 DIAGNOSIS — Z23 Encounter for immunization: Secondary | ICD-10-CM

## 2022-01-23 DIAGNOSIS — Z0000 Encounter for general adult medical examination without abnormal findings: Secondary | ICD-10-CM

## 2022-01-23 DIAGNOSIS — Z7189 Other specified counseling: Secondary | ICD-10-CM

## 2022-01-23 DIAGNOSIS — Z659 Problem related to unspecified psychosocial circumstances: Secondary | ICD-10-CM

## 2022-01-23 NOTE — Progress Notes (Signed)
I have personally reviewed the Medicare Annual Wellness questionnaire and have noted ?1. The patient's medical and social history ?2. Their use of alcohol, tobacco or illicit drugs ?3. Their current medications and supplements ?4. The patient's functional ability including ADL's, fall risks, home safety risks and hearing or visual ?            impairment. ?5. Diet and physical activities ?6. Evidence for depression or mood disorders ? ?The patients weight, height, BMI have been recorded in the chart and visual acuity is per eye clinic.  ?I have made referrals, counseling and provided education to the patient based review of the above and I have provided the pt with a written personalized care plan for preventive services. ? ?Provider list updated- see scanned forms.  Routine anticipatory guidance given to patient.  See health maintenance. The possibility exists that previously documented standard health maintenance information may have been brought forward from a previous encounter into this note.  If needed, that same information has been updated to reflect the current situation based on today's encounter.   ? ?Flu previously done ?Shingles up-to-date ?PNA-20 done 2023 ?Tetanus previously done ?COVID-vaccine previously done ?Colonoscopy 2018 ?Breast cancer screening 2022 ?DXA per Dr. Gale Journey at [facility].  I will defer.  She agrees ?Advance directive-husband designated if patient were incapacitated. ?Cognitive function addressed- see scanned forms- and if abnormal then additional documentation follows.  ? ?Hearing and vision screen done at office visit. ?EKG done at office visit, discussed with patient.  No history of tachycardia.  Incidentally noted to have shorter PR interval but this is not thought to be clinically significant.  Discussed with patient.  No exertional chest pain.  Not short of breath.  Good exercise tolerance. ? ?In addition to [facility], follow up visit for the below conditions: ? ?She is still  caring for her mother and aunt.  D/w pt.  Both have been admitted to hospital recently.  She was up all night caring for both of them. She had chest discomfort at the time, at rest.  She doesn't have exertional sx now.  She can exercise well in the meantime.  Mother is in ALF and patient sees her about 5x/week.   ? ?PMH and SH reviewed ?Meds, vitals, and allergies reviewed.  ? ?ROS: Per HPI.  Unless specifically indicated otherwise in HPI, the patient denies: ? ?General: fever. ?Eyes: acute vision changes ?ENT: sore throat ?Cardiovascular: chest pain ?Respiratory: SOB ?GI: vomiting ?GU: dysuria ?Musculoskeletal: acute back pain ?Derm: acute rash ?Neuro: acute motor dysfunction ?Psych: worsening mood ?Endocrine: polydipsia ?Heme: bleeding ?Allergy: hayfever ? ?GEN: nad, alert and oriented ?HEENT: MMM ?NECK: supple w/o LA ?CV: rrr. ?PULM: ctab, no inc wob ?ABD: soft, +bs ?EXT: no edema ?SKIN: no acute rash ?

## 2022-01-23 NOTE — Patient Instructions (Signed)
Take care.  Glad to see you. Update me as needed.  

## 2022-01-27 DIAGNOSIS — Z659 Problem related to unspecified psychosocial circumstances: Secondary | ICD-10-CM | POA: Insufficient documentation

## 2022-01-27 NOTE — Assessment & Plan Note (Signed)
Advance directive- husband designated if patient were incapacitated.  

## 2022-01-27 NOTE — Assessment & Plan Note (Signed)
Flu previously done ?Shingles up-to-date ?PNA-20 done 2023 ?Tetanus previously done ?COVID-vaccine previously done ?Colonoscopy 2018 ?Breast cancer screening 2022 ?DXA per Dr. Gale Journey at [facility].  I will defer.  She agrees ?Advance directive-husband designated if patient were incapacitated. ?Cognitive function addressed- see scanned forms- and if abnormal then additional documentation follows.  ? ?Hearing and vision screen done at office visit. ?EKG done at office visit, discussed with patient.  No history of tachycardia.  Incidentally noted to have shorter PR interval but this is not thought to be clinically significant.  Discussed with patient.  No chest pain.  Not short of breath.  Good exercise tolerance. ?

## 2022-01-27 NOTE — Assessment & Plan Note (Signed)
She can exercise without chest pain.  Her previous episode of chest discomfort was not exertional and it was when she had been up all night caring for her mother and her aunt when they were hospitalized.  I thanked her for her effort and we talked about the strain of being the caregiver.  I do not suspect ominous cardiovascular pathophysiology.  EKG discussed with patient.  She can update me as needed. ?

## 2022-05-26 IMAGING — DX DG FINGER THUMB 2+V*R*
2 series · 2 of 2 positions shown · non-contrast
Comparison: None.

CLINICAL DATA: Hyperintense tension of right thumb.

EXAM:
RIGHT THUMB 2+V

[thumb pa]
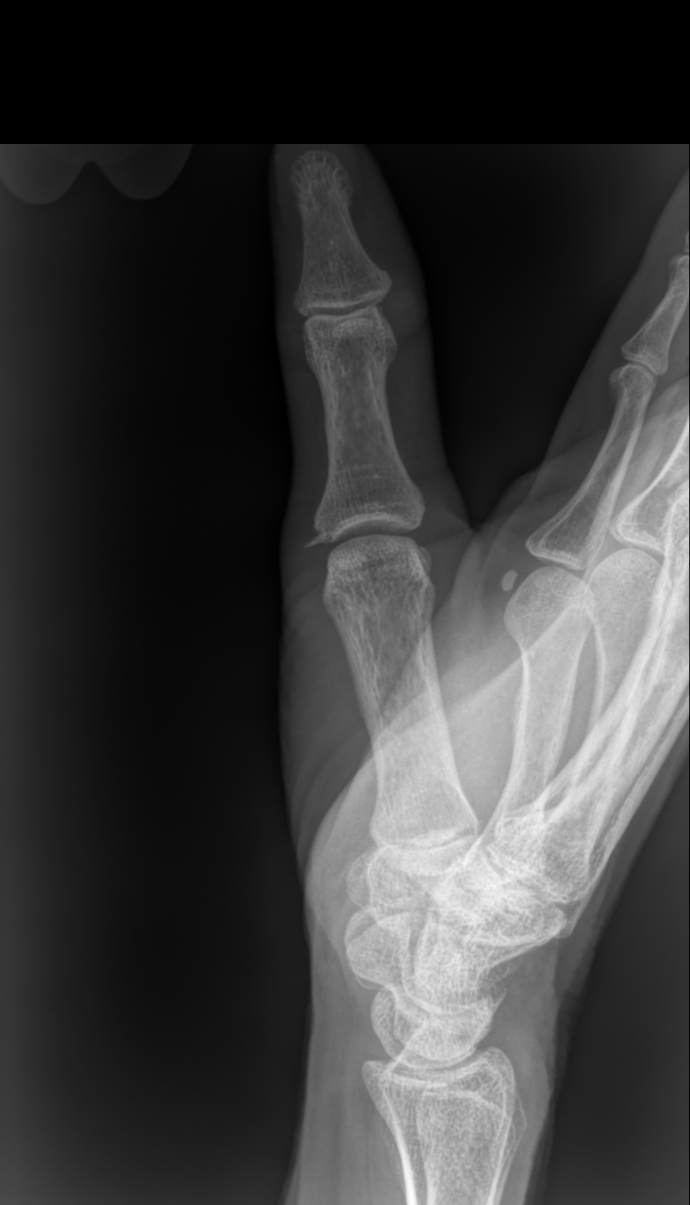

[thumb lat]
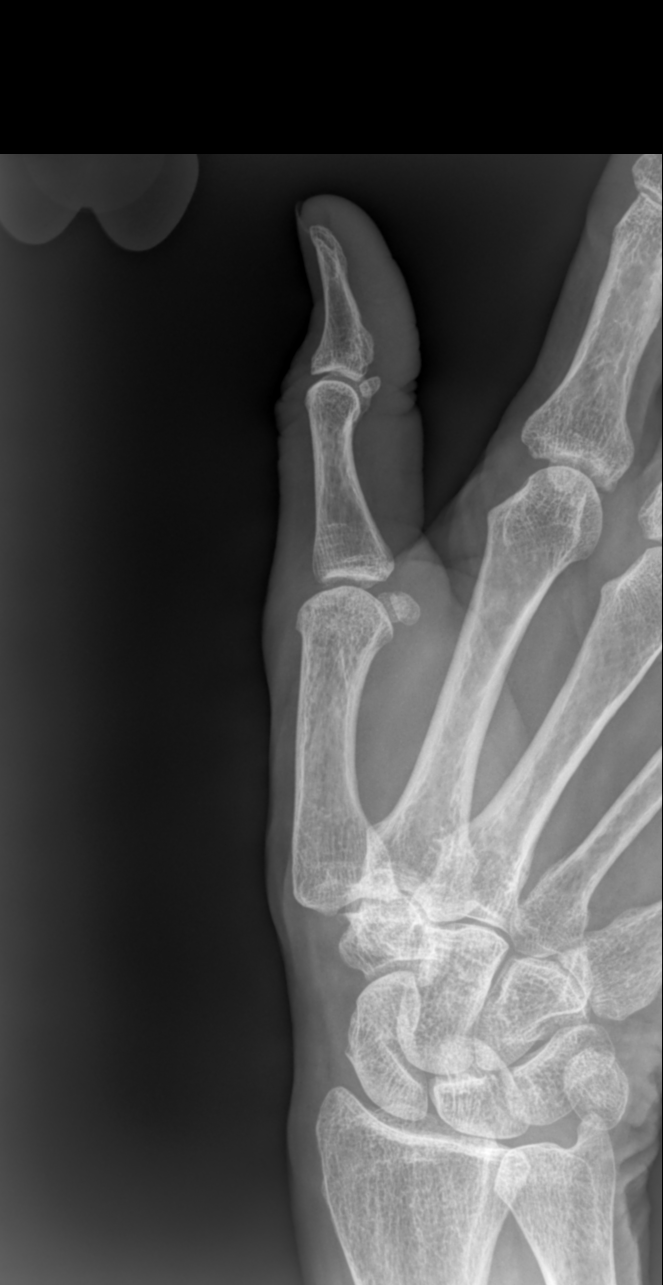

[2 of 2 positions shown; findings below may reference images not displayed]

FINDINGS: Intra-articular fracture of the lateral base of the first digit
proximal phalanx. Associated 4 mm linear fracture fragment. No
dislocation. There is no evidence of arthropathy or other focal bone
abnormality. Soft tissues are unremarkable. No retained radiopaque
foreign body
IMPRESSION: Intra-articular fracture of the lateral base of the first digit
proximal phalanx.

## 2022-07-02 LAB — HM MAMMOGRAPHY

## 2022-07-03 ENCOUNTER — Encounter: Payer: Self-pay | Admitting: Obstetrics and Gynecology

## 2022-11-13 ENCOUNTER — Ambulatory Visit (INDEPENDENT_AMBULATORY_CARE_PROVIDER_SITE_OTHER): Payer: Medicare Other | Admitting: Family Medicine

## 2022-11-13 ENCOUNTER — Encounter: Payer: Self-pay | Admitting: Family Medicine

## 2022-11-13 VITALS — BP 120/64 | HR 82 | Temp 97.2°F | Ht 65.9 in | Wt 123.0 lb

## 2022-11-13 DIAGNOSIS — M542 Cervicalgia: Secondary | ICD-10-CM

## 2022-11-13 MED ORDER — CYCLOBENZAPRINE HCL 10 MG PO TABS: 5.0000 mg | ORAL_TABLET | Freq: Three times a day (TID) | ORAL | 1 refills | Status: DC | PRN

## 2022-11-13 MED ORDER — MELOXICAM 15 MG PO TABS: 7.5000 mg | ORAL_TABLET | Freq: Every day | ORAL | 1 refills | Status: DC | PRN

## 2022-11-13 NOTE — Patient Instructions (Addendum)
Likely combination of muscle pain (trap and SCM) and occipital neuralgia.   Consider changing pillows.   Reasonable to try meloxicam.   Flexeril if needed, sedation caution.  Gently stretch.   Take care.  Glad to see you.

## 2022-11-13 NOTE — Progress Notes (Signed)
Neck pain.  Started around the early part of December.  Pain on R posterior neck mpain with ROM laterally to either side.  Tried heat, massage, advil, exercising/rest.  Some better.  ROM is better but not back to normal.  Less pain with flexion or extension.  No trauma.  No radicular pain.  No recent pillow change but she may need a harder pillow in general, d/w pt.  She is caring for her mother, using a hoyer lift but she has to reposition her mother after the fact.  Her aunt recently died.  Patient is also caring for another aunt.  Normal grip.  Pain can radiate up to the posterior scalp.  No facial.    She has a grandchild on the way, due in May.    Meds, vitals, and allergies reviewed.   ROS: Per HPI unless specifically indicated in ROS section   Nad Ncat Neck supple.  No lymphadenopathy. Rrr Ctab She has tenderness along the bilateral trapezius on the posterior portion of the neck, near the occipital ridge.  She has tenderness on the upper portion of the sternocleidomastoids, also near the occipital ridge.  No rash. Normal motor function in the extremities x 4 on gross check.

## 2022-11-15 DIAGNOSIS — M542 Cervicalgia: Secondary | ICD-10-CM | POA: Insufficient documentation

## 2022-11-15 NOTE — Assessment & Plan Note (Signed)
Likely combination of muscle pain (trap and SCM) and occipital neuralgia.   Consider changing pillows.   Reasonable to try meloxicam.  NSAID caution discussed with patient. Flexeril if needed, sedation caution.  Gently stretch.   Okay to defer imaging at this point.

## 2023-01-16 ENCOUNTER — Other Ambulatory Visit: Payer: Self-pay | Admitting: Family Medicine

## 2023-01-16 DIAGNOSIS — M810 Age-related osteoporosis without current pathological fracture: Secondary | ICD-10-CM

## 2023-01-16 DIAGNOSIS — E785 Hyperlipidemia, unspecified: Secondary | ICD-10-CM

## 2023-01-22 ENCOUNTER — Other Ambulatory Visit (INDEPENDENT_AMBULATORY_CARE_PROVIDER_SITE_OTHER): Payer: Medicare Other

## 2023-01-22 DIAGNOSIS — M810 Age-related osteoporosis without current pathological fracture: Secondary | ICD-10-CM

## 2023-01-22 DIAGNOSIS — E785 Hyperlipidemia, unspecified: Secondary | ICD-10-CM

## 2023-01-22 LAB — COMPREHENSIVE METABOLIC PANEL
ALT: 20 U/L (ref 0–35)
AST: 21 U/L (ref 0–37)
Albumin: 4.1 g/dL (ref 3.5–5.2)
Alkaline Phosphatase: 41 U/L (ref 39–117)
BUN: 14 mg/dL (ref 6–23)
CO2: 28 mEq/L (ref 19–32)
Calcium: 9.4 mg/dL (ref 8.4–10.5)
Chloride: 104 mEq/L (ref 96–112)
Creatinine, Ser: 0.81 mg/dL (ref 0.40–1.20)
GFR: 75.52 mL/min (ref >60.00)
Glucose, Bld: 92 mg/dL (ref 70–99)
Potassium: 4.2 mEq/L (ref 3.5–5.1)
Sodium: 138 mEq/L (ref 135–145)
Total Bilirubin: 0.6 mg/dL (ref 0.2–1.2)
Total Protein: 6.6 g/dL (ref 6.0–8.3)

## 2023-01-22 LAB — LIPID PANEL
Cholesterol: 178 mg/dL (ref 0–200)
HDL: 67.5 mg/dL (ref >39.00)
LDL Cholesterol: 99 mg/dL (ref 0–99)
NonHDL: 110.29
Total CHOL/HDL Ratio: 3
Triglycerides: 58 mg/dL (ref 0.0–149.0)
VLDL: 11.6 mg/dL (ref 0.0–40.0)

## 2023-01-22 LAB — VITAMIN D 25 HYDROXY (VIT D DEFICIENCY, FRACTURES): VITD: 36.16 ng/mL (ref 30.00–100.00)

## 2023-01-30 ENCOUNTER — Encounter: Payer: Medicare Other | Admitting: Family Medicine

## 2023-02-02 ENCOUNTER — Ambulatory Visit (INDEPENDENT_AMBULATORY_CARE_PROVIDER_SITE_OTHER): Payer: Medicare Other | Admitting: Family Medicine

## 2023-02-02 ENCOUNTER — Encounter: Payer: Self-pay | Admitting: Family Medicine

## 2023-02-02 VITALS — BP 118/80 | HR 75 | Temp 97.1°F | Ht 66.5 in | Wt 119.0 lb

## 2023-02-02 DIAGNOSIS — Z0000 Encounter for general adult medical examination without abnormal findings: Secondary | ICD-10-CM

## 2023-02-02 DIAGNOSIS — Z7189 Other specified counseling: Secondary | ICD-10-CM

## 2023-02-02 DIAGNOSIS — M810 Age-related osteoporosis without current pathological fracture: Secondary | ICD-10-CM

## 2023-02-02 DIAGNOSIS — Z659 Problem related to unspecified psychosocial circumstances: Secondary | ICD-10-CM

## 2023-02-02 NOTE — Progress Notes (Unsigned)
I have personally reviewed the Medicare Annual Wellness questionnaire and have noted 1. The patient's medical and social history 2. Their use of alcohol, tobacco or illicit drugs 3. Their current medications and supplements 4. The patient's functional ability including ADL's, fall risks, home safety risks and hearing or visual             impairment. 5. Diet and physical activities 6. Evidence for depression or mood disorders  The patients weight, height, BMI have been recorded in the chart and visual acuity is per eye clinic.  I have made referrals, counseling and provided education to the patient based review of the above and I have provided the pt with a written personalized care plan for preventive services.  Provider list updated- see scanned forms.  Routine anticipatory guidance given to patient.  See health maintenance. The possibility exists that previously documented standard health maintenance information may have been brought forward from a previous encounter into this note.  If needed, that same information has been updated to reflect the current situation based on today's encounter.    Flu Shingles PNA Tetanus Colon  Breast cancer screening Has gyn f/u pending for 2024.   Advance directive Cognitive function addressed- see scanned forms- and if abnormal then additional documentation follows.   In addition to [facility], follow up visit for the below conditions:  Osteoporosis.  Per outside clinic.    Mother on hospice at [facility]. Patient is checking on her daily.  I thanked her for her effort.   She noted a change with swallowing after some food was hung up without choking.  She questioned about persistent irritation. Intermittent sx.  Not SOB and normal swallowing o/w.   PMH and SH reviewed  Meds, vitals, and allergies reviewed.   ROS: Per HPI.  Unless specifically indicated otherwise in HPI, the patient denies:  General: fever. Eyes: acute vision  changes ENT: sore throat Cardiovascular: chest pain Respiratory: SOB GI: vomiting GU: dysuria Musculoskeletal: acute back pain Derm: acute rash Neuro: acute motor dysfunction Psych: worsening mood Endocrine: polydipsia Heme: bleeding Allergy: hayfever  GEN: nad, alert and oriented HEENT: mucous membranes moist NECK: supple w/o LA CV: rrr. PULM: ctab, no inc wob ABD: soft, +bs EXT: no edema SKIN: no acute rash

## 2023-02-02 NOTE — Patient Instructions (Addendum)
Tdap when possible.   If you keep having throat symptoms then let me know.   Take care.  Glad to see you. Thanks for your effort.

## 2023-02-04 NOTE — Assessment & Plan Note (Signed)
Flu previously done Shingles up-to-date PNA up-to-date Tetanus 2014 COVID-vaccine previously done Colon cancer screening 2018 Breast cancer screening 2023 Bone density test per [facility].  I will defer. Has gyn f/u pending for 2024.   Advance directive-husband designated if patient were incapacitated. Cognitive function addressed- see scanned forms- and if abnormal then additional documentation follows.

## 2023-02-04 NOTE — Assessment & Plan Note (Signed)
Per outside clinic.  I will defer.  She agrees. 

## 2023-02-04 NOTE — Assessment & Plan Note (Signed)
Advance directive- husband designated if patient were incapacitated.  

## 2023-02-04 NOTE — Assessment & Plan Note (Signed)
Her mother is on hospice.  Her son and daughter-in-law are expecting a baby.  She has a lot going on.  I thanked her for her effort.

## 2023-02-16 ENCOUNTER — Other Ambulatory Visit: Payer: Medicare Other

## 2023-02-23 ENCOUNTER — Encounter: Payer: Medicare Other | Admitting: Family Medicine

## 2023-05-26 ENCOUNTER — Encounter: Payer: Self-pay | Admitting: Family

## 2023-05-26 ENCOUNTER — Ambulatory Visit (INDEPENDENT_AMBULATORY_CARE_PROVIDER_SITE_OTHER): Payer: Medicare Other | Admitting: Family

## 2023-05-26 VITALS — BP 110/68 | HR 90 | Temp 97.9°F | Ht 66.5 in | Wt 121.0 lb

## 2023-05-26 DIAGNOSIS — J0140 Acute pansinusitis, unspecified: Secondary | ICD-10-CM | POA: Insufficient documentation

## 2023-05-26 DIAGNOSIS — J3489 Other specified disorders of nose and nasal sinuses: Secondary | ICD-10-CM | POA: Diagnosis not present

## 2023-05-26 MED ORDER — AMOXICILLIN-POT CLAVULANATE 875-125 MG PO TABS: 1.0000 | ORAL_TABLET | Freq: Two times a day (BID) | ORAL | 0 refills | Status: DC

## 2023-05-26 NOTE — Progress Notes (Signed)
Established Patient Office Visit  Subjective:   Patient ID: Sabrina Hansen, female    DOB: 09-22-1956  Age: 67 y.o. MRN: [id number]  CC:  Chief Complaint  Patient presents with   Facial Pain    Under eyes into teeth. Cough and congestion and fullness in ears x 6 days. Patient had home covid test negative.     HPI: Sabrina Hansen is a 67 y.o. female presenting on 05/26/2023 for Facial Pain (Under eyes into teeth. Cough and congestion and fullness in ears x 6 days. Patient had home covid test negative. )  six days ago started with sinus pressure, and now with posterior eye tenderness with jaw/teeth pain. She does feel she has fullness in the ears as well.  Had been on vacation when symptoms started. No fever or chills.  She does have some pnd with some mild sore throat from the drainage. She now has a dry cough. No sob or chest congestion.   She did test negative for covid Yesterday.        ROS: Negative unless specifically indicated above in HPI.   Relevant past medical history reviewed and updated as indicated.   Allergies and medications reviewed and updated.   Current Outpatient Medications:    amoxicillin-clavulanate (AUGMENTIN) 875-125 MG tablet, Take 1 tablet by mouth 2 (two) times daily., Disp: 20 tablet, Rfl: 0   Calcium Carb-Cholecalciferol 600-800 MG-UNIT TABS, Take by mouth daily. 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      Objective:   BP 110/68   Pulse 90   Temp 97.9 F (36.6 C) (Temporal)   Ht 5' 6.5" (1.689 m)   Wt 121 lb (54.9 kg)   LMP 05/27/2009    SpO2 98%   BMI 19.24 kg/m    Physical Exam Constitutional:      General: She is not in acute distress.    Appearance: Normal appearance. She is normal weight. She is not ill-appearing, toxic-appearing or diaphoretic.  HENT:     Head: Normocephalic.     Right Ear: Tympanic membrane normal.     Left Ear: Tympanic membrane normal.     Nose:     Right Sinus: Maxillary sinus tenderness present.     Left Sinus: Maxillary sinus tenderness present.     Mouth/Throat:     Mouth: Mucous membranes are dry.     Pharynx: No oropharyngeal exudate or posterior oropharyngeal erythema.  Eyes:     Extraocular Movements: Extraocular movements intact.     Pupils: Pupils are equal, round, and reactive to light.  Cardiovascular:     Rate and Rhythm: Normal rate and regular rhythm.     Pulses: Normal pulses.     Heart sounds: Normal heart sounds.  Pulmonary:     Effort: Pulmonary effort is normal.     Breath sounds: Normal breath sounds.  Musculoskeletal:     Cervical back: Normal range of motion.  Neurological:     General: No focal deficit present.     Mental Status: She is  alert and oriented to person, place, and time. Mental status is at baseline.  Psychiatric:        Mood and Affect: Mood normal.        Behavior: Behavior normal.        Thought Content: Thought content normal.        Judgment: Judgment normal.     Assessment & Plan:  Acute non-recurrent pansinusitis Assessment & Plan: Prescription given for augmentin 875/125 mg po bid for ten days. Pt to continue tylenol/ibuprofen prn sinus pain. Continue with humidifier prn and steam showers recommended as well. instructed If no symptom improvement in 48 hours please f/u   Orders: -     Amoxicillin-Pot Clavulanate; Take 1 tablet by mouth 2 (two) times daily.  Dispense: 20 tablet; Refill: 0  Sinus pressure     Follow up plan: Return for f/u PCP if no improvement in symptoms.  Mort Sawyers, FNP

## 2023-05-26 NOTE — Assessment & Plan Note (Signed)
Prescription given for augmentin 875/125 mg po bid for ten days. Pt to continue tylenol/ibuprofen prn sinus pain. Continue with humidifier prn and steam showers recommended as well. instructed If no symptom improvement in 48 hours please f/u ? ?

## 2023-07-10 LAB — HM MAMMOGRAPHY

## 2023-07-13 ENCOUNTER — Encounter: Payer: Self-pay | Admitting: Obstetrics and Gynecology

## 2023-07-27 ENCOUNTER — Ambulatory Visit: Payer: Self-pay | Admitting: Obstetrics and Gynecology

## 2023-08-04 NOTE — Progress Notes (Unsigned)
67 y.o. G2P1 Married Caucasian female here for annual exam.   Had brown mucusy discharge for 2 days in August.  No pain.  No intercourse preceded this.  Not sexually active.    She is back on Actonel, 35 mg once a week.  She is followed at [facility] by Dr. Valarie Cones.   Mother passed in April from Parkinson's. Grand daughter born soon after.  Patient is taking care of her grand daughter.   PCP: Joaquim Nam, MD   Patient's last menstrual period was 05/27/2009.           Sexually active: No.  The current method of family planning is post menopausal status.    Exercising: Yes.     Step aerobics, weights, walking, yoga Smoker:  no  OB History  Gravida Para Term Preterm AB Living  2 1       1   SAB IAB Ectopic Multiple Live Births               # Outcome Date GA Lbr Len/2nd Weight Sex Type Anes PTL Lv  2 Para           1 Gravida              Health Maintenance: Pap:   07/24/21 normal, neg HR HPV, 01-21-17 Neg:Neg HR HPV, 12-16-13 Neg:Neg HR HPV  History of abnormal Pap:  no MMG: 07/10/23 Breast Density Cat B, BI-RADS CAT 1 neg Colonoscopy:   [facility] Colonoscopy          Colonoscopy (Every 10 Years) Next due on 11/24/2026    11/24/2016  COLONOSCOPY   Only the first 1 history entries have been loaded, but more history exists.           BMD:  03/27/20  Result  osteoporosis.  07/2022 - osteopenia. [facility].  HIV: 12/09/16 NR Hep C: 12/09/16 neg  Immunization History  Administered Date(s) Administered   Influenza Split 09/06/2012   Influenza Whole 08/28/2010   Influenza,inj,Quad PF,6+ Mos 08/28/2015, 08/28/2016, 09/01/2017, 09/02/2018, 08/11/2019   Influenza-Unspecified 08/13/2020, 08/23/2021, 08/15/2022   PFIZER(Purple Top)SARS-COV-2 Vaccination 01/07/2020, 02/03/2020, 08/31/2020, 03/29/2021   PNEUMOCOCCAL CONJUGATE-20 01/23/2022   Pfizer Covid-19 Vaccine Bivalent Booster 18yrs & up 10/07/2021   Tdap 12/10/2012   Zoster Recombinant(Shingrix) 06/02/2018, 09/24/2018     {Labs  (Optional):23779}   reports that she has never smoked. She has never used smokeless tobacco. She reports current alcohol use. She reports that she does not use drugs.  Past Medical History:  Diagnosis Date   Anemia    ASCUS on Pap smear 2006   Asc-us pap w/HRHPV   Breast cyst 1998 1999   2005 x3   Endometriosis    Family history of alcoholism    Family history of diabetes mellitus    1st degree relative   Femoral hernia    right   Nodular scleritis of right eye    Osteoporosis 2016   started actonel 2016    Past Surgical History:  Procedure Laterality Date   Abdominal endometriosis  1991   removal right endometrioma   Breast biopsy and benign lump removal Left 1992   Breast Bx atypia (L)   BUNIONECTOMY  2011 and 2012   COLONOSCOPY     INGUINAL HERNIA REPAIR  05/2014   Dr. Purnell Shoemaker   LAPAROSCOPY  1990   endometriosis   TONSILLECTOMY      Current Outpatient Medications  Medication Sig Dispense Refill   Calcium Carb-Cholecalciferol 600-800 MG-UNIT TABS Take by  mouth daily. Talking 1 TUMS concurrently     Multiple Vitamin (MULTIVITAMIN) tablet Take 1 tablet by mouth daily.     Risedronate Sodium 35 MG TBEC Take 1 tablet by mouth once a week.     Vitamin D, Cholecalciferol, 50 MCG (2000 UT) CAPS Take by mouth daily.     No current facility-administered medications for this visit.    Family History  Problem Relation Age of Onset   Arthritis Mother    Hyperlipidemia Mother    Hypertension Mother    Parkinsonism Mother    Heart disease Mother    Pulmonary embolism Mother    Arthritis Father    Hyperlipidemia Father    Hypertension Father    Heart disease Father        CAD (6 CABG)   Stroke Father    Diabetes Sister    Alcohol abuse Brother        dec 58 09/2015 drugs & alcohol   Arthritis Other    Hypertension Other    Heart disease Other    Colon cancer Neg Hx    Breast cancer Neg Hx     Review of Systems  All other systems reviewed and are  negative.   Exam:   BP 126/76 (BP Location: Right Arm, Patient Position: Sitting, Cuff Size: Normal)   Pulse 79   Ht 5\' 6"  (1.676 m)   Wt 122 lb (55.3 kg)   LMP 05/27/2009   SpO2 97%   BMI 19.69 kg/m     General appearance: alert, cooperative and appears stated age Head: normocephalic, without obvious abnormality, atraumatic Neck: no adenopathy, supple, symmetrical, trachea midline and thyroid normal to inspection and palpation Lungs: clear to auscultation bilaterally Breasts: normal appearance, no masses or tenderness, No nipple retraction or dimpling, No nipple discharge or bleeding, No axillary adenopathy Heart: regular rate and rhythm Abdomen: soft, non-tender; no masses, no organomegaly Extremities: extremities normal, atraumatic, no cyanosis or edema Skin: skin color, texture, turgor normal. No rashes or lesions Lymph nodes: cervical, supraclavicular, and axillary nodes normal. Neurologic: grossly normal  Pelvic: External genitalia:  no lesions              No abnormal inguinal nodes palpated.              Urethra:  normal appearing urethra with no masses, tenderness or lesions              Bartholins and Skenes: normal                 Vagina: normal appearing vagina with normal color and discharge, no lesions              Cervix: no lesions              Pap taken: {yes no:314532} Bimanual Exam:  Uterus:  normal size, contour, position, consistency, mobility, non-tender              Adnexa: no mass, fullness, tenderness              Rectal exam: {yes no:314532}.  Confirms.              Anus:  normal sphincter tone, no lesions  Chaperone was present for exam:  {BSCHAPERONE:31226::"Emily F, CMA"}   Assessment and Plan:    Remote hx of abnormal pap.  Postmenopausal bleeding.  Osteoporosis.  Endocrinology following.  Stable uterine fibroid.   Mammogram screening discussed. Self breast awareness reviewed. Guidelines for Calcium, Vitamin D,  regular exercise program  including cardiovascular and weight bearing exercise.   No follow-ups on file.   Additional counseling given.  {yes [id number]. _______ minutes face to face time of which over 50% was spent in counseling.    After visit summary provided.

## 2023-08-18 ENCOUNTER — Encounter: Payer: Self-pay | Admitting: Obstetrics and Gynecology

## 2023-08-18 ENCOUNTER — Other Ambulatory Visit (HOSPITAL_COMMUNITY)
Admission: RE | Admit: 2023-08-18 | Discharge: 2023-08-18 | Disposition: A | Discharge status: Home / Self Care | Payer: Medicare Other | Source: Ambulatory Visit | Attending: Obstetrics and Gynecology | Admitting: Obstetrics and Gynecology

## 2023-08-18 ENCOUNTER — Ambulatory Visit (INDEPENDENT_AMBULATORY_CARE_PROVIDER_SITE_OTHER): Payer: Medicare Other | Admitting: Obstetrics and Gynecology

## 2023-08-18 VITALS — BP 126/76 | HR 79 | Ht 66.0 in | Wt 122.0 lb

## 2023-08-18 DIAGNOSIS — N950 Postmenopausal bleeding: Secondary | ICD-10-CM | POA: Diagnosis not present

## 2023-08-18 DIAGNOSIS — Z01419 Encounter for gynecological examination (general) (routine) without abnormal findings: Secondary | ICD-10-CM | POA: Diagnosis not present

## 2023-08-18 DIAGNOSIS — Z1151 Encounter for screening for human papillomavirus (HPV): Secondary | ICD-10-CM | POA: Insufficient documentation

## 2023-08-18 DIAGNOSIS — Z124 Encounter for screening for malignant neoplasm of cervix: Secondary | ICD-10-CM | POA: Insufficient documentation

## 2023-08-18 DIAGNOSIS — Z01411 Encounter for gynecological examination (general) (routine) with abnormal findings: Secondary | ICD-10-CM | POA: Diagnosis present

## 2023-08-18 NOTE — Patient Instructions (Signed)

## 2023-08-24 LAB — CYTOLOGY - PAP
Comment: NEGATIVE
Diagnosis: NEGATIVE
High risk HPV: NEGATIVE

## 2023-08-27 ENCOUNTER — Ambulatory Visit: Payer: Medicare Other

## 2023-08-27 DIAGNOSIS — N950 Postmenopausal bleeding: Secondary | ICD-10-CM | POA: Diagnosis not present

## 2023-08-28 ENCOUNTER — Telehealth: Payer: Self-pay | Admitting: *Deleted

## 2023-08-28 NOTE — Telephone Encounter (Signed)
Call returned to patient. PUS completed 08/27/23. Patient is currently in [place], [place], requesting results then OV if needed, this is a 6 hour drive for her.  OV cancelled for 08/31/23 for PUS consult. Patient will wait for PUS results and reschedule OV if needed. Advised current turn around time for PUS 10-14 days. Patient appreciate of return call.   Routing to Dr. Marjorie Smolder.   Encounter closed.

## 2023-08-31 ENCOUNTER — Ambulatory Visit: Payer: Medicare Other | Admitting: Obstetrics and Gynecology

## 2023-12-02 ENCOUNTER — Ambulatory Visit: Payer: Self-pay | Admitting: Family Medicine

## 2023-12-02 NOTE — Telephone Encounter (Signed)
  Chief Complaint: right index finger pain and swelling Symptoms: right index finger pain and swelling from knuckle to 2nd/middle joint Frequency: x 10 days (improving) Pertinent Negatives: Patient denies fever, numbness, bruising. Disposition: [] ED /[x] Urgent Care (no appt availability in office) / [] Appointment(In office/virtual)/ []  [facility]/ [] Home Care/ [] Refused Recommended Disposition /[] [facility]/ []  Follow-up with PCP Additional Notes: Patient states she has iced the finger and taken Advil which has helped some. Patient denies any known injury to finger. She states the finger is mild pain but worsens when bending, using or if she accidentally hits it against something. Patient states she is currently in [place]  and is agreeable to go to Urgent care within th next 3 days for care. Patient requesting an appointment to follow up after urgent care with Dr Cleatus.  Copied from CRM [id number]. Topic: Clinical - Red Word Triage >> Dec 02, 2023  3:33 PM Drema MATSU wrote: Red Word that prompted transfer to Nurse Triage: Patient thinks she injured her index finger. She states that when she woke up it was swollen (2nd joint down to the knuckle/ palm on opposite side), can't touch palm,  the joint is very painful, does not have full range of motion. Patient has been ising and taking advil. Reason for Disposition  [1] MODERATE pain (e.g., interferes with normal activities) AND [2] present > 3 days  Answer Assessment - Initial Assessment Questions 1. ONSET: When did the pain start?      X 10 days.  2. LOCATION and RADIATION: Where is the pain located?  (e.g., fingertip, around nail, joint, entire  finger)      Right hand index finger from knuckle to the middle joint.  3. SEVERITY: How bad is the pain? What does it keep you from doing?   (Scale 1-10; or mild, moderate, severe)  - MILD (1-3): doesn't interfere with normal activities.   - MODERATE (4-7): interferes  with normal activities or awakens from sleep.  - SEVERE (8-10): excruciating pain, unable to hold a glass of water or bend finger even a little.     Patient states she can not bend the finger fully, she states at rest it is 1-2/10 and if she brushes it up against something or bumps it is goes to a 7-8/10.  4. APPEARANCE: What does the finger look like? (e.g., redness, swelling, bruising, pallor)     Swelling (has decreased since it originally started), sometimes it looks a little blue to me (not significantly).  5. WORK OR EXERCISE: Has there been any recent work or exercise that involved this part (i.e., fingers or hand) of the body?     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  6. CAUSE: What do you think is causing the pain?     Unsure. She states her son is a armed forces operational officer and said he thought it could be gout.  7. AGGRAVATING FACTORS: What makes the pain worse? (e.g., using computer)     Use of the finger or bending of the finger, painful to touch.  8. OTHER SYMPTOMS: Do you have any other symptoms? (e.g., fever, neck pain, numbness)     Denies.  Protocols used: Finger Pain-A-AH

## 2023-12-03 NOTE — Telephone Encounter (Signed)
 Noted. Thanks.

## 2023-12-06 ENCOUNTER — Ambulatory Visit
Admission: EM | Admit: 2023-12-06 | Discharge: 2023-12-06 | Disposition: A | Discharge status: Home / Self Care | Payer: Medicare Other | Attending: Physician Assistant | Admitting: Physician Assistant

## 2023-12-06 ENCOUNTER — Ambulatory Visit (INDEPENDENT_AMBULATORY_CARE_PROVIDER_SITE_OTHER): Payer: Medicare Other

## 2023-12-06 DIAGNOSIS — M1990 Unspecified osteoarthritis, unspecified site: Secondary | ICD-10-CM

## 2023-12-06 DIAGNOSIS — M79644 Pain in right finger(s): Secondary | ICD-10-CM

## 2023-12-06 MED ORDER — MELOXICAM 15 MG PO TABS
15.0000 mg | ORAL_TABLET | Freq: Every day | ORAL | 2 refills | Status: DC
Start: 2023-12-06 — End: 2023-12-17

## 2023-12-06 NOTE — ED Triage Notes (Signed)
 Pt states, two weeks ago today I woke up with it swollen and started to ice it. I called PCP to make an appt and they suggested I come to Urgent Care. I do not have full range of motion. It is very painful and swollen to the joint on my hand. I cannot bend it all the way. Iceing it helps but does not take the swelling away completely.

## 2023-12-06 NOTE — Discharge Instructions (Addendum)
 Return if any problems.

## 2023-12-06 NOTE — ED Provider Notes (Signed)
 [facility]    CSN: [id number] Arrival date & time: 12/06/23  1021      History   Chief Complaint Chief Complaint  Patient presents with   Finger Injury    HPI Sabrina Hansen is a 68 y.o. female.   Patient complains of swelling in her right index finger that began 2 weeks ago.  Patient reports she does not recall any injury.  Patient reports her son was concerned that she could have gout in her finger.  Patient denies any repetitive use.  She denies any redness or heat.  Patient has difficulty flexing her finger  The history is provided by the patient.    Past Medical History:  Diagnosis Date   Anemia    ASCUS on Pap smear 2006   Asc-us  pap w/HRHPV   Breast cyst 1998 1999   2005 x3   Endometriosis    Family history of alcoholism    Family history of diabetes mellitus    1st degree relative   Femoral hernia    right   Nodular scleritis of right eye    Osteoporosis 2016   started actonel  2016    Patient Active Problem List   Diagnosis Date Noted   Sinus pressure 05/26/2023   Other social stressor 01/27/2022   Strain of back 03/15/2019   Scleritis 12/27/2018   Fibroid 02/18/2018   HLD (hyperlipidemia) 12/20/2017   Advance care planning 12/17/2016   Osteoporosis 09/16/2010    Past Surgical History:  Procedure Laterality Date   Abdominal endometriosis  1991   removal right endometrioma   Breast biopsy and benign lump removal Left 1992   Breast Bx atypia (L)   BUNIONECTOMY  2011 and 2012   COLONOSCOPY     INGUINAL HERNIA REPAIR  05/2014   Dr. Jessy   LAPAROSCOPY  1990   endometriosis   TONSILLECTOMY      OB History     Gravida  2   Para  1   Term      Preterm      AB      Living  1      SAB      IAB      Ectopic      Multiple      Live Births               Home Medications    Prior to Admission medications   Medication Sig Start Date End Date Taking? Authorizing Provider  meloxicam  (MOBIC ) 15 MG tablet  Take 1 tablet (15 mg total) by mouth daily. 12/06/23 12/05/24 Yes Danilo Cappiello K, PA-C  Calcium Carb-Cholecalciferol 600-800 MG-UNIT TABS Take by mouth daily. Talking 1 TUMS concurrently    [provider]  Multiple Vitamin (MULTIVITAMIN) tablet Take 1 tablet by mouth daily.    [provider]  Risedronate  Sodium 35 MG TBEC Take 1 tablet by mouth once a week.    [provider]  Vitamin D , Cholecalciferol, 50 MCG (2000 UT) CAPS Take by mouth daily.    [provider]    Family History Family History  Problem Relation Age of Onset   Arthritis Mother    Hyperlipidemia Mother    Hypertension Mother    Parkinsonism Mother    Heart disease Mother    Pulmonary embolism Mother    Arthritis Father    Hyperlipidemia Father    Hypertension Father    Heart disease Father  CAD (6 CABG)   Stroke Father    Diabetes Sister    Alcohol abuse Brother        dec 58 09/2015 drugs & alcohol   Arthritis Other    Hypertension Other    Heart disease Other    Colon cancer Neg Hx    Breast cancer Neg Hx     Social History Social History   Tobacco Use   Smoking status: Never   Smokeless tobacco: Never  Vaping Use   Vaping status: Never Used  Substance Use Topics   Alcohol use: Yes    Comment: 1 drink/month   Drug use: No     Allergies   Actonel  [risedronate  sodium]   Review of Systems Review of Systems  Musculoskeletal:  Positive for joint swelling and myalgias.  All other systems reviewed and are negative.    Physical Exam Triage Vital Signs ED Triage Vitals  Encounter Vitals Group     BP 12/06/23 1136 135/79     Systolic BP Percentile --      Diastolic BP Percentile --      Pulse Rate 12/06/23 1136 73     Resp 12/06/23 1136 18     Temp 12/06/23 1136 98.4 F (36.9 C)     Temp Source 12/06/23 1136 Oral     SpO2 12/06/23 1136 96 %     Weight --      Height --      Head Circumference --      Peak Flow --      Pain Score 12/06/23  1132 5     Pain Loc --      Pain Education --      Exclude from Growth Chart --    No data found.  Updated Vital Signs BP 135/79 (BP Location: Left Arm)   Pulse 73   Temp 98.4 F (36.9 C) (Oral)   Resp 18   LMP 05/27/2009   SpO2 96%   Visual Acuity Right Eye Distance:   Left Eye Distance:   Bilateral Distance:    Right Eye Near:   Left Eye Near:    Bilateral Near:     Physical Exam Vitals and nursing note reviewed.  Constitutional:      Appearance: She is well-developed.  HENT:     Head: Normocephalic.  Cardiovascular:     Rate and Rhythm: Normal rate.  Pulmonary:     Effort: Pulmonary effort is normal.  Abdominal:     General: There is no distension.  Musculoskeletal:        General: Swelling and tenderness present.     Comments: Swollen right index finger at MCP and PIP  Skin:    General: Skin is warm.  Neurological:     General: No focal deficit present.     Mental Status: She is alert and oriented to person, place, and time.      UC Treatments / Results  Labs (all labs ordered are listed, but only abnormal results are displayed) Labs Reviewed - No data to display  EKG   Radiology DG Finger Index Right Result Date: 12/06/2023 CLINICAL DATA:  Right index finger pain and swelling. EXAM: RIGHT INDEX FINGER 2+V COMPARISON:  None Available. FINDINGS: There is no evidence of fracture or dislocation. Early degenerative spurring is seen involving the MCP joint and DIP joint. IMPRESSION: No acute findings. Early degenerative spurring involving the MCP and DIP joints. Electronically Signed   By: Norleen DELENA Graham CHRISTELLA.D.  On: 12/06/2023 13:04    Procedures Procedures (including critical care time)  Medications Ordered in UC Medications - No data to display  Initial Impression / Assessment and Plan / UC Course  I have reviewed the triage vital signs and the nursing notes.  Pertinent labs & imaging results that were available during my care of the patient were  reviewed by me and considered in my medical decision making (see chart for details).     X-ray shows degenerative spurring involving the MCP and DIP.  Patient counseled on results patient is given a prescription for meloxicam .  I advised follow-up with Dr. Josefina who is on-call for hand surgery. Final Clinical Impressions(s) / UC Diagnoses   Final diagnoses:  Finger pain, right  Arthritis     Discharge Instructions      Return if any problems.     ED Prescriptions     Medication Sig Dispense Auth. Provider   meloxicam  (MOBIC ) 15 MG tablet Take 1 tablet (15 mg total) by mouth daily. 30 tablet Winnifred Dufford K, PA-C     An After Visit Summary was printed and given to the patient.     PDMP not reviewed this encounter.   Flint Sonny POUR, [place] 12/06/23 1337

## 2023-12-17 ENCOUNTER — Encounter: Payer: Self-pay | Admitting: Family Medicine

## 2023-12-17 ENCOUNTER — Ambulatory Visit: Payer: Medicare Other | Admitting: Family Medicine

## 2023-12-17 ENCOUNTER — Ambulatory Visit (INDEPENDENT_AMBULATORY_CARE_PROVIDER_SITE_OTHER): Payer: Medicare Other | Admitting: Family Medicine

## 2023-12-17 VITALS — BP 124/68 | HR 63 | Temp 98.5°F | Ht 66.0 in | Wt 124.0 lb

## 2023-12-17 DIAGNOSIS — M2550 Pain in unspecified joint: Secondary | ICD-10-CM

## 2023-12-17 MED ORDER — MELOXICAM 15 MG PO TABS: 15.0000 mg | ORAL_TABLET | Freq: Every day | ORAL | 1 refills | Status: DC

## 2023-12-17 NOTE — Progress Notes (Signed)
 Taking meloxicam for the last 10 days.  Prev eval at outside clinic.  She had pain, woke up and R index finger was puffy.  No known injury.  Iced it and took some advil.  Had pain at the R 2nd MCP.  Pain with ROM at the time.  Had slow improvement over the next 2 weeks.    She has been babysitting her granddaughter in [place].  Her granddaughter is 43 months old.    Her mother died last year, d/w pt.  Condolences offered.    Meds, vitals, and allergies reviewed.   ROS: Per HPI unless specifically indicated in ROS section   Nad ncat R 2nd finger with full extension but mild dec in flexion.  She is clearly better but not back to baseline. Minimally puffy now at the MCP.  Distally NV intact.

## 2023-12-17 NOTE — Patient Instructions (Addendum)
Go to the lab on the way out.   If you have mychart we'll likely use that to update you.    Take care.  Glad to see you. Finish current rx for meloxicam, with food.  If recurrent symptoms, then restart meloxicam and update [place].  Look at the gout diet in the meantime.

## 2023-12-18 LAB — URIC ACID: Uric Acid, Serum: 2.7 mg/dL (ref 2.4–7.0)

## 2023-12-19 DIAGNOSIS — M2550 Pain in unspecified joint: Secondary | ICD-10-CM | POA: Insufficient documentation

## 2023-12-19 NOTE — Assessment & Plan Note (Signed)
 Presumed/possible gout, ddx d/w pt. See note on labs.   Finish current rx for meloxicam, with food.  If recurrent symptoms, then restart meloxicam and update [place].  Look at the gout diet in the meantime- handout given and discussed.

## 2023-12-20 ENCOUNTER — Encounter: Payer: Self-pay | Admitting: Family Medicine

## 2023-12-23 ENCOUNTER — Telehealth: Payer: Self-pay

## 2023-12-23 DIAGNOSIS — E785 Hyperlipidemia, unspecified: Secondary | ICD-10-CM

## 2023-12-23 DIAGNOSIS — M810 Age-related osteoporosis without current pathological fracture: Secondary | ICD-10-CM

## 2023-12-23 NOTE — Addendum Note (Signed)
 Addended by: Joaquim Nam on: 12/23/2023 01:59 PM   Modules accepted: Orders

## 2023-12-23 NOTE — Telephone Encounter (Signed)
 Copied from CRM [id number]. Topic: Clinical - Request for Lab/Test Order >> Dec 23, 2023 11:29 AM Dennison Nancy wrote: Reason for CRM: patient requesting for lab order need in may

## 2023-12-23 NOTE — Telephone Encounter (Signed)
 Done. Thanks.

## 2024-03-02 ENCOUNTER — Encounter (HOSPITAL_COMMUNITY): Payer: Self-pay

## 2024-03-07 ENCOUNTER — Encounter (HOSPITAL_COMMUNITY): Payer: Self-pay

## 2024-03-09 ENCOUNTER — Ambulatory Visit: Payer: Self-pay | Admitting: Family Medicine

## 2024-03-09 ENCOUNTER — Ambulatory Visit (INDEPENDENT_AMBULATORY_CARE_PROVIDER_SITE_OTHER): Payer: Medicare Other

## 2024-03-09 ENCOUNTER — Other Ambulatory Visit (INDEPENDENT_AMBULATORY_CARE_PROVIDER_SITE_OTHER): Payer: Medicare Other

## 2024-03-09 VITALS — BP 106/62 | Ht 66.5 in | Wt 121.8 lb

## 2024-03-09 DIAGNOSIS — Z0000 Encounter for general adult medical examination without abnormal findings: Secondary | ICD-10-CM

## 2024-03-09 DIAGNOSIS — E785 Hyperlipidemia, unspecified: Secondary | ICD-10-CM | POA: Diagnosis not present

## 2024-03-09 DIAGNOSIS — M810 Age-related osteoporosis without current pathological fracture: Secondary | ICD-10-CM | POA: Diagnosis not present

## 2024-03-09 DIAGNOSIS — R5383 Other fatigue: Secondary | ICD-10-CM

## 2024-03-09 LAB — LIPID PANEL
Cholesterol: 194 mg/dL (ref 0–200)
HDL: 58.6 mg/dL (ref >39.00)
LDL Cholesterol: 119 mg/dL — ABNORMAL HIGH (ref 0–99)
NonHDL: 135.09
Total CHOL/HDL Ratio: 3
Triglycerides: 78 mg/dL (ref 0.0–149.0)
VLDL: 15.6 mg/dL (ref 0.0–40.0)

## 2024-03-09 LAB — CBC WITH DIFFERENTIAL/PLATELET
Basophils Absolute: 0.1 10*3/uL (ref 0.0–0.1)
Basophils Relative: 0.7 % (ref 0.0–3.0)
Eosinophils Absolute: 0.2 10*3/uL (ref 0.0–0.7)
Eosinophils Relative: 2.2 % (ref 0.0–5.0)
HCT: 38.5 % (ref 36.0–46.0)
Hemoglobin: 12.9 g/dL (ref 12.0–15.0)
Lymphocytes Relative: 60 % — ABNORMAL HIGH (ref 12.0–46.0)
Lymphs Abs: 4.3 10*3/uL — ABNORMAL HIGH (ref 0.7–4.0)
MCHC: 33.4 g/dL (ref 30.0–36.0)
MCV: 88.4 fl (ref 78.0–100.0)
Monocytes Absolute: 0.7 10*3/uL (ref 0.1–1.0)
Monocytes Relative: 9.8 % (ref 3.0–12.0)
Neutro Abs: 2 10*3/uL (ref 1.4–7.7)
Neutrophils Relative %: 27.3 % — ABNORMAL LOW (ref 43.0–77.0)
Platelets: 218 10*3/uL (ref 150.0–400.0)
RBC: 4.36 Mil/uL (ref 3.87–5.11)
RDW: 12.9 % (ref 11.5–15.5)
WBC: 7.2 10*3/uL (ref 4.0–10.5)

## 2024-03-09 LAB — COMPREHENSIVE METABOLIC PANEL WITH GFR
ALT: 46 U/L — ABNORMAL HIGH (ref 0–35)
AST: 35 U/L (ref 0–37)
Albumin: 4.1 g/dL (ref 3.5–5.2)
Alkaline Phosphatase: 56 U/L (ref 39–117)
BUN: 13 mg/dL (ref 6–23)
CO2: 30 meq/L (ref 19–32)
Calcium: 9.3 mg/dL (ref 8.4–10.5)
Chloride: 105 meq/L (ref 96–112)
Creatinine, Ser: 0.77 mg/dL (ref 0.40–1.20)
GFR: 79.61 mL/min (ref >60.00)
Glucose, Bld: 95 mg/dL (ref 70–99)
Potassium: 4.2 meq/L (ref 3.5–5.1)
Sodium: 141 meq/L (ref 135–145)
Total Bilirubin: 0.5 mg/dL (ref 0.2–1.2)
Total Protein: 6.7 g/dL (ref 6.0–8.3)

## 2024-03-09 LAB — VITAMIN D 25 HYDROXY (VIT D DEFICIENCY, FRACTURES): VITD: 45.64 ng/mL (ref 30.00–100.00)

## 2024-03-09 NOTE — Progress Notes (Signed)
 Please attest and cosign this visit due to patients primary care provider not being in the office at the time the visit was completed.    Subjective:   Sabrina Hansen is a 68 y.o. who presents for a Medicare Wellness preventive visit.  As a reminder, Annual Wellness Visits don't include a physical exam, and some assessments may be limited, especially if this visit is performed virtually. We may recommend an in-person visit if needed.  Visit Complete: In person  Persons Participating in Visit: Patient.  AWV Questionnaire: No: Patient Medicare AWV questionnaire was not completed prior to this visit.  Cardiac Risk Factors include: advanced age (>35men, >32 women);dyslipidemia     Objective:     Today's Vitals   03/09/24 1457  BP: 106/62  Weight: 121 lb 12.8 oz (55.2 kg)  Height: 5' 6.5" (1.689 m)   Body mass index is 19.36 kg/m.     03/09/2024    3:29 PM 09/29/2016    1:41 PM 08/26/2016    8:45 AM  Advanced Directives  Does Patient Have a Medical Advance Directive? Yes Yes Yes  Type of Estate agent of [place];Living will Healthcare Power of [place];Living will Healthcare Power of [place];Living will  Copy of Healthcare Power of Attorney in Chart? No - copy requested  No - copy requested    Current Medications (verified) Outpatient Encounter Medications as of 03/09/2024  Medication Sig   Calcium Carb-Cholecalciferol 600-800 MG-UNIT TABS Take by mouth daily. Talking 1 TUMS concurrently   meloxicam  (MOBIC ) 15 MG tablet Take 1 tablet (15 mg total) by mouth daily.   Multiple Vitamin (MULTIVITAMIN) tablet Take 1 tablet by mouth daily.   Risedronate  Sodium 35 MG TBEC Take 1 tablet by mouth once a week.   Vitamin D , Cholecalciferol, 50 MCG (2000 UT) CAPS Take by mouth daily.   No facility-administered encounter medications on file as of 03/09/2024.    Allergies (verified) Actonel  [risedronate  sodium]   History: Past Medical History:  Diagnosis  Date   Anemia    ASCUS on Pap smear 2006   Asc-us  pap w/HRHPV   Breast cyst 1998 1999   2005 x3   Endometriosis    Family history of alcoholism    Family history of diabetes mellitus    1st degree relative   Femoral hernia    right   Nodular scleritis of right eye    Osteoporosis 2016   started actonel  2016   Past Surgical History:  Procedure Laterality Date   Abdominal endometriosis  1991   removal right endometrioma   Breast biopsy and benign lump removal Left 1992   Breast Bx atypia (L)   BUNIONECTOMY  2011 and 2012   COLONOSCOPY     INGUINAL HERNIA REPAIR  05/2014   Dr. Tacy [facility]   LAPAROSCOPY  1990   endometriosis   TONSILLECTOMY     Family History  Problem Relation Age of Onset   Arthritis Mother    Hyperlipidemia Mother    Hypertension Mother    Parkinsonism Mother    Heart disease Mother    Pulmonary embolism Mother    Arthritis Father    Hyperlipidemia Father    Hypertension Father    Heart disease Father        CAD (6 CABG)   Stroke Father    Diabetes Sister    Alcohol abuse Brother        dec 58 09/2015 drugs & alcohol   Arthritis Other    Hypertension  Other    Heart disease Other    Colon cancer Neg Hx    Breast cancer Neg Hx    Social History   Socioeconomic History   Marital status: Married    Spouse name: Not on file   Number of children: 1   Years of education: Not on file   Highest education level: Not on file  Occupational History   Occupation: Transport planner: [employer]  Tobacco Use   Smoking status: Never   Smokeless tobacco: Never  Vaping Use   Vaping status: Never Used  Substance and Sexual Activity   Alcohol use: Yes    Comment: 1 drink/month   Drug use: No   Sexual activity: Not Currently    Partners: Male    Birth control/protection: Post-menopausal    Comment: less than 5, after 16, no STD, no abnormal pap in last 7 years, no DES  Other Topics Concern   Not on file  Social History Narrative    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      Granddaughter Sabrina Hansen   Social Drivers of Health   Financial Resource Strain: Low Risk  (03/09/2024)   Overall Financial Resource Strain (CARDIA)    Difficulty of Paying Living Expenses: Not hard at all  Food Insecurity: No Food Insecurity (03/09/2024)   Hunger Vital Sign    Worried About Running Out of Food in the Last Year: Never true    Ran Out of Food in the Last Year: Never true  Transportation Needs: No Transportation Needs (03/09/2024)   PRAPARE - Administrator, Civil Service (Medical): No    Lack of Transportation (Non-Medical): No  Physical Activity: Sufficiently Active (03/09/2024)   Exercise Vital Sign    Days of Exercise per Week: 5 days    Minutes of Exercise per Session: 60 min  Stress: No Stress Concern Present (03/09/2024)   [name] - Occupational Stress Questionnaire    Feeling of Stress : Not at all  Social Connections: Socially Integrated (03/09/2024)   Social Connection and Isolation Panel [NHANES]    Frequency of Communication with Friends and Family: More than three times a week    Frequency of Social Gatherings with Friends and Family: Three times a week    Attends Religious Services: More than 4 times per year    Active Member of Clubs or Organizations: Yes    Attends Engineer, structural: More than 4 times per year    Marital Status: Married    Tobacco Counseling Counseling given: Not Answered    Clinical Intake:  Pre-visit preparation completed: Yes  Pain : No/denies pain     BMI - recorded: 19.36 Nutritional Status: BMI of 19-24  Normal Nutritional Risks: None Diabetes:  No  No results found for: "HGBA1C"   How often do you need to have someone help you when you read instructions, pamphlets, or other written materials from your doctor or pharmacy?: 1 - Never  Interpreter Needed?: No  Comments: lives w/husband Information entered by :: B.Dnya Hickle,LPN   Activities of Daily Living     03/09/2024    3:30 PM  In your present state  of health, do you have any difficulty performing the following activities:  Hearing? 0  Vision? 0  Difficulty concentrating or making decisions? 0  Walking or climbing stairs? 0  Dressing or bathing? 0  Doing errands, shopping? 0  Preparing Food and eating ? N  Using the Toilet? N  In the past six months, have you accidently leaked urine? N  Do you have problems with loss of bowel control? N  Managing your Medications? N  Managing your Finances? N  Housekeeping or managing your Housekeeping? N    Patient Care Team: Donnie Galea, MD as PCP - General Greta Leatherwood, MD as Consulting Physician (Obstetrics and Gynecology)  Indicate any recent Medical Services you may have received from other than [facility] providers in the past year (date may be approximate).     Assessment:    This is a routine wellness examination for Sabrina Hansen.  Hearing/Vision screen Hearing Screening - Comments:: Pt says her hearing is good Vision Screening - Comments:: Pt states her vision is good w/contacts [place]   Goals Addressed             This Visit's Progress    Patient Stated       I would like to get back into normal exercise routine this fall and get back to my exercise group. I would like to keep moving       Depression Screen     03/09/2024    3:22 PM 12/17/2023    3:00 PM 05/26/2023   12:09 PM 02/02/2023   10:15 AM 11/13/2022    2:15 PM 01/23/2022   11:54 AM 01/17/2021   11:19 AM  PHQ 2/9 Scores  PHQ - 2 Score 0 0 0 0 0 0 0  PHQ- 9 Score  0 0 1 1  0    Fall Risk     03/09/2024    3:10 PM 12/17/2023     3:00 PM 05/26/2023   12:10 PM 02/02/2023   10:15 AM 11/13/2022    2:15 PM  Fall Risk   Falls in the past year? 0 0 0 0 0  Number falls in past yr: 0 0 0 0 0  Injury with Fall? 0 0 0 0 0  Risk for fall due to : No Fall Risks No Fall Risks No Fall Risks No Fall Risks No Fall Risks  Follow up Falls prevention discussed;Education provided Falls evaluation completed Falls evaluation completed Falls evaluation completed Falls evaluation completed    MEDICARE RISK AT HOME:  Medicare Risk at Home Any stairs in or around the home?: Yes If so, are there any without handrails?: Yes Home free of loose throw rugs in walkways, pet beds, electrical cords, etc?: Yes Adequate lighting in your home to reduce risk of falls?: Yes Life alert?: No Use of a cane, walker or w/c?: No Grab bars in the bathroom?: Yes Shower chair or bench in shower?: Yes Elevated toilet seat or a handicapped toilet?: Yes  TIMED UP AND GO:  Was the test performed?  Yes  Length of time to ambulate 10 feet: 8 sec Gait steady and fast without use of assistive device  Cognitive Function: 6CIT completed        03/09/2024    4:04 PM  6CIT Screen  What Year? 0 points  What month? 0 points  What time? 0 points  Count back from 20 0 points  Months in reverse 0 points  Repeat phrase 0 points  Total Score 0 points    Immunizations Immunization History  Administered Date(s) Administered   Fluad Quad(high Dose 65+) 08/15/2022   Fluad Trivalent(High Dose 65+) 08/15/2023   Influenza Split 09/06/2012   Influenza Whole 08/28/2010   Influenza,inj,Quad PF,6+ Mos 08/28/2015, 08/28/2016, 09/01/2017, 09/02/2018, 08/11/2019   Influenza-Unspecified 08/13/2020, 08/23/2021, 08/15/2022   PFIZER Comirnaty(Gray Top)Covid-19 Tri-Sucrose Vaccine 03/29/2021   PFIZER(Purple Top)SARS-COV-2 Vaccination 01/07/2020, 02/03/2020, 08/31/2020, 03/29/2021   PNEUMOCOCCAL CONJUGATE-20 01/23/2022   Pfizer Covid-19 Vaccine Bivalent Booster 13yrs & up  10/07/2021   Tdap 12/10/2012   Zoster Recombinant(Shingrix) 06/02/2018, 09/24/2018    Screening Tests Health Maintenance  Topic Date Due   DTaP/Tdap/Td (2 - Td or Tdap) 12/10/2022   COVID-19 Vaccine (7 - 2024-25 season) 06/28/2023   INFLUENZA VACCINE  05/27/2024   MAMMOGRAM  07/09/2024   Medicare Annual Wellness (AWV)  03/09/2025   Colonoscopy  11/24/2026   Pneumonia Vaccine 88+ Years old  Completed   DEXA SCAN  Completed   Hepatitis C Screening  Completed   Zoster Vaccines- Shingrix  Completed   HPV VACCINES  Aged Out   Meningococcal B Vaccine  Aged Out    Health Maintenance  Health Maintenance Due  Topic Date Due   DTaP/Tdap/Td (2 - Td or Tdap) 12/10/2022   COVID-19 Vaccine (7 - 2024-25 season) 06/28/2023   Health Maintenance Items Addressed: None needed at this time  Additional Screening:  Vision Screening: Recommended annual ophthalmology exams for early detection of glaucoma and other disorders of the eye.  Dental Screening: Recommended annual dental exams for proper oral hygiene  Community Resource Referral / Chronic Care Management: CRR required this visit?  No   CCM required this visit?  No   Plan:    I have personally reviewed and noted the following in the patient's chart:   Medical and social history Use of alcohol, tobacco or illicit drugs  Current medications and supplements including opioid prescriptions. Patient is not currently taking opioid prescriptions. Functional ability and status Nutritional status Physical activity Advanced directives List of other physicians Hospitalizations, surgeries, and ER visits in previous 12 months Vitals Screenings to include cognitive, depression, and falls Referrals and appointments  In addition, I have reviewed and discussed with patient certain preventive protocols, quality metrics, and best practice recommendations. A written personalized care plan for preventive services as well as general preventive  health recommendations were provided to patient.   Nerissa Bannister, LPN   6/64/4034   After Visit Summary: (MyChart) Due to this being a telephonic visit, the after visit summary with patients personalized plan was offered to patient via MyChart   Notes: Nothing significant to report at this time.

## 2024-03-09 NOTE — Addendum Note (Signed)
 Addended by: Gerry Krone on: 03/09/2024 09:31 AM   Modules accepted: Orders

## 2024-03-09 NOTE — Patient Instructions (Addendum)
 Sabrina Hansen , Thank you for taking time out of your busy schedule to complete your Annual Wellness Visit with me. I enjoyed our conversation and look forward to speaking with you again next year. I, as well as your care team,  appreciate your ongoing commitment to your health goals. Please review the following plan we discussed and let me know if I can assist you in the future. Your Game plan/ To Do List     Follow up Visits: Next Medicare AWV with our clinical staff: 03/10/25 @ 3pm televisit   Have you seen your provider in the last 6 months (3 months if uncontrolled diabetes)? Yes Next Office Visit with your provider: 03/14/24  Clinician Recommendations:  Aim for 30 minutes of exercise or brisk walking, 6-8 glasses of water, and 5 servings of fruits and vegetables each day.       This is a list of the screening recommended for you and due dates:  Health Maintenance  Topic Date Due   DTaP/Tdap/Td vaccine (2 - Td or Tdap) 12/10/2022   COVID-19 Vaccine (7 - 2024-25 season) 06/28/2023   Flu Shot  05/27/2024   Mammogram  07/09/2024   Medicare Annual Wellness Visit  03/09/2025   Colon Cancer Screening  11/24/2026   Pneumonia Vaccine  Completed   DEXA scan (bone density measurement)  Completed   Hepatitis C Screening  Completed   Zoster (Shingles) Vaccine  Completed   HPV Vaccine  Aged Out   Meningitis B Vaccine  Aged Out    Advanced directives: (Copy Requested) Please bring a copy of your health care power of attorney and living will to the office to be added to your chart at your convenience. You can mail to [facility] [address] [place], [place] [address] or email to [email or web address] Advance Care Planning is important because it:  [x]  Makes sure you receive the medical care that is consistent with your values, goals, and preferences  [x]  It provides guidance to your family and loved ones and reduces their decisional burden about whether or not they are  making the right decisions based on your wishes.  Follow the link provided in your after visit summary or read over the paperwork we have mailed to you to help you started getting your Advance Directives in place. If you need assistance in completing these, please reach out to us  so that we can help you!

## 2024-03-14 ENCOUNTER — Ambulatory Visit: Payer: Medicare Other | Admitting: Family Medicine

## 2024-03-14 ENCOUNTER — Telehealth: Payer: Self-pay

## 2024-03-14 NOTE — Telephone Encounter (Signed)
 Please call her back.  I get her point.  Her ALT was minimally abnormal.  I was planning on talking her about this today.  I do not think this is alarming.  Her other liver tests are fine.  I think it makes sense to get her rescheduled when possible, at a time that is convenient for her.  We could recheck her LFTs at the visit.  If we need to we could send an order to [facility] for her to get her labs rechecked in the meantime.  If she wants to get that done, please let me know.  She has minimal changes on her CBC that are not alarming.   If she has other concerns let me know.  Please let her know that I apologize for my absence today.  It was due to an unexpected family member's illness. Thanks.

## 2024-03-14 NOTE — Telephone Encounter (Signed)
 Copied from CRM [id number]. Topic: Clinical - Lab/Test Results >> Mar 14, 2024  8:41 AM Yolande Hench C wrote: Reason for CRM: Patient has requested a follow up call from a nurse to discuss abnormal lab results; please follow up with patient [phone]

## 2024-03-14 NOTE — Telephone Encounter (Signed)
 Returned call to patient and she wants to know what she can do about her abnormal labs. She is wondering should be concerned or alarmed about them. She is a bit upset because she is not sure that on the 27th she will be able to come in since her appointment was rescheduled today since she resides primarily in [place] helping with her grandkids. Please advise.

## 2024-03-15 NOTE — Telephone Encounter (Signed)
 Spoke to patient and patient was verified, I gave her Dr. Azell Boll notes and patient stated she is okay with having labs done at her next appointment on 03/22/2024. Patient didn't state any other concerns to discuss.

## 2024-03-16 NOTE — Telephone Encounter (Signed)
 Noted. Thanks.

## 2024-03-22 ENCOUNTER — Encounter: Payer: Self-pay | Admitting: Family Medicine

## 2024-03-22 ENCOUNTER — Ambulatory Visit (INDEPENDENT_AMBULATORY_CARE_PROVIDER_SITE_OTHER): Admitting: Family Medicine

## 2024-03-22 VITALS — BP 104/58 | HR 79 | Temp 98.7°F | Ht 66.26 in | Wt 121.2 lb

## 2024-03-22 DIAGNOSIS — Z0000 Encounter for general adult medical examination without abnormal findings: Secondary | ICD-10-CM

## 2024-03-22 DIAGNOSIS — M810 Age-related osteoporosis without current pathological fracture: Secondary | ICD-10-CM

## 2024-03-22 DIAGNOSIS — Z7189 Other specified counseling: Secondary | ICD-10-CM

## 2024-03-22 DIAGNOSIS — R7989 Other specified abnormal findings of blood chemistry: Secondary | ICD-10-CM

## 2024-03-22 DIAGNOSIS — R0789 Other chest pain: Secondary | ICD-10-CM

## 2024-03-22 LAB — HEPATIC FUNCTION PANEL
ALT: 40 U/L — ABNORMAL HIGH (ref 0–35)
AST: 31 U/L (ref 0–37)
Albumin: 4.1 g/dL (ref 3.5–5.2)
Alkaline Phosphatase: 55 U/L (ref 39–117)
Bilirubin, Direct: 0.1 mg/dL (ref 0.0–0.3)
Total Bilirubin: 0.5 mg/dL (ref 0.2–1.2)
Total Protein: 6.4 g/dL (ref 6.0–8.3)

## 2024-03-22 NOTE — Progress Notes (Unsigned)
 Flu previously done Shingles up-to-date PNA up-to-date Tetanus 2014 COVID-vaccine previously done Colon cancer screening 2018 Breast cancer screening 2023 Bone density test per [facility].  I will defer. Has gyn f/u 2024.   Advance directive-husband designated if patient were incapacitated.  She had 3 episodes of brief chest discomfort to either side of the sternum.  Not SOB.  Once at rest just prior to a meal, once sitting in the car, and once at rest at home.  All resolved after a few minutes.  She doesn't have exertional sx, ie not with exercise.  Heart monitor on watch didn't record A fib.  She thought it was muscular in origin.  EKG done at office visit discussed with patient.  See below.  She had neg w/u for post menopausal bleeding.  No sx in the meantime.    Osteoporosis.  Per outside clinic.  I will defer.  She agrees.  Had held risedronate  when she got her labs back.  She is going to f/u with endo clinic.    LFT elevation.  D/w pt.  No FCNAVD.  No jaundice.    She is still helping care for her granddaughter.    Meds, vitals, and allergies reviewed.   ROS: Per HPI unless specifically indicated in ROS section   GEN: nad, alert and oriented HEENT: mucous membranes moist NECK: supple w/o LA CV: rrr.   PULM: ctab, no inc wob ABD: soft, +bs EXT: no edema SKIN: no acute rash  EKG with normal sinus rhythm and no acute changes.  Discussed with patient at office visit.  Normal.  35 minutes were devoted to patient care in this encounter (this includes time spent reviewing the patient's file/history, interviewing and examining the patient, counseling/reviewing plan with patient).

## 2024-03-22 NOTE — Patient Instructions (Addendum)
 Tdap when possible.   Go to the lab on the way out.   If you have mychart we'll likely use that to update you.    Take care.  Glad to see you. If you have more symptoms then let me know.

## 2024-03-23 ENCOUNTER — Encounter: Payer: Self-pay | Admitting: Family Medicine

## 2024-03-23 ENCOUNTER — Other Ambulatory Visit: Payer: Self-pay | Admitting: Family Medicine

## 2024-03-23 ENCOUNTER — Ambulatory Visit: Payer: Self-pay | Admitting: Family Medicine

## 2024-03-23 DIAGNOSIS — R7989 Other specified abnormal findings of blood chemistry: Secondary | ICD-10-CM | POA: Insufficient documentation

## 2024-03-23 DIAGNOSIS — Z0000 Encounter for general adult medical examination without abnormal findings: Secondary | ICD-10-CM | POA: Insufficient documentation

## 2024-03-23 DIAGNOSIS — R0789 Other chest pain: Secondary | ICD-10-CM | POA: Insufficient documentation

## 2024-03-23 NOTE — Assessment & Plan Note (Signed)
 Of unclear source, benign exam.  No history of jaundice.  I think it makes sense to recheck LFTs and we can consider imaging if she has persistent transaminitis.  Okay for outpatient follow-up.

## 2024-03-23 NOTE — Assessment & Plan Note (Signed)
Advance directive- husband designated if patient were incapacitated.  

## 2024-03-23 NOTE — Assessment & Plan Note (Signed)
 I do not expect this to be cardiac, she is low risk.  I think it makes sense to observe this for now and if she has persistent symptoms she can let me know.

## 2024-03-23 NOTE — Assessment & Plan Note (Signed)
 Flu previously done Shingles up-to-date PNA up-to-date Tetanus 2014 COVID-vaccine previously done Colon cancer screening 2018 Breast cancer screening 2023 Bone density test per [facility].  I will defer. Has gyn f/u 2024.   Advance directive-husband designated if patient were incapacitated.

## 2024-03-23 NOTE — Assessment & Plan Note (Signed)
 Per outside clinic.  I will defer.  She agrees.  Had held risedronate  when she got her labs back.  She is going to f/u with endo clinic.   I would not expect risedronate  to cause LFT elevation after she had already been on the medication for years, discussed.

## 2024-03-25 ENCOUNTER — Ambulatory Visit: Admitting: Family Medicine

## 2024-03-29 ENCOUNTER — Telehealth: Payer: Self-pay | Admitting: Family Medicine

## 2024-03-29 NOTE — Telephone Encounter (Signed)
 Copied from CRM [id number]. Topic: Clinical - Request for Lab/Test Order >> Mar 29, 2024  3:18 PM Rosamond Comes wrote: Reason for CRM: patient returning call regarding liver scan. Patient would like to talk to Theone Fitting CMA Please have Ashtyn call patient [phone]

## 2024-04-14 ENCOUNTER — Ambulatory Visit
Admission: RE | Admit: 2024-04-14 | Discharge: 2024-04-14 | Disposition: A | Discharge status: Home / Self Care | Source: Ambulatory Visit | Attending: Family Medicine | Admitting: Family Medicine

## 2024-04-14 ENCOUNTER — Ambulatory Visit: Payer: Self-pay | Admitting: Family Medicine

## 2024-04-14 DIAGNOSIS — R7989 Other specified abnormal findings of blood chemistry: Secondary | ICD-10-CM

## 2024-04-15 NOTE — Telephone Encounter (Signed)
 Pt had this scan completed on 04/14/24  Nothing further needed.

## 2024-06-23 ENCOUNTER — Other Ambulatory Visit

## 2024-07-07 ENCOUNTER — Other Ambulatory Visit (INDEPENDENT_AMBULATORY_CARE_PROVIDER_SITE_OTHER)

## 2024-07-07 DIAGNOSIS — R7989 Other specified abnormal findings of blood chemistry: Secondary | ICD-10-CM | POA: Diagnosis not present

## 2024-07-07 LAB — HEPATIC FUNCTION PANEL
ALT: 18 U/L (ref 0–35)
AST: 17 U/L (ref 0–37)
Albumin: 4.1 g/dL (ref 3.5–5.2)
Alkaline Phosphatase: 33 U/L — ABNORMAL LOW (ref 39–117)
Bilirubin, Direct: 0.1 mg/dL (ref 0.0–0.3)
Total Bilirubin: 0.6 mg/dL (ref 0.2–1.2)
Total Protein: 6.6 g/dL (ref 6.0–8.3)

## 2024-07-10 ENCOUNTER — Ambulatory Visit: Payer: Self-pay | Admitting: Family Medicine

## 2024-07-13 LAB — HM MAMMOGRAPHY

## 2024-07-19 ENCOUNTER — Encounter: Payer: Self-pay | Admitting: Obstetrics and Gynecology

## 2024-07-20 ENCOUNTER — Ambulatory Visit: Payer: Self-pay | Admitting: Family Medicine

## 2024-11-10 ENCOUNTER — Telehealth: Payer: Self-pay

## 2024-11-10 NOTE — Telephone Encounter (Signed)
 Copied from CRM [id number]. Topic: Clinical - Request for Lab/Test Order >> Nov 10, 2024  4:06 PM Adelita E wrote: Reason for CRM: Patient has her physical on 5/28 and she is really hoping to come in for labs the week prior. If that is an option, please call patient to schedule labs.

## 2025-03-10 ENCOUNTER — Ambulatory Visit

## 2025-03-16 ENCOUNTER — Other Ambulatory Visit

## 2025-03-23 ENCOUNTER — Encounter: Admitting: Family Medicine

## 2025-03-23 ENCOUNTER — Ambulatory Visit

## 2025-03-24 ENCOUNTER — Encounter: Admitting: Family Medicine
